# Patient Record
Sex: Female | Born: 1985 | Race: White | Hispanic: No | Smoking: Former smoker
Health system: Southern US, Community
[De-identification: ages and names within clinical notes are randomized; demographics above are authoritative.]

## PROBLEM LIST (undated history)

## (undated) DIAGNOSIS — M069 Rheumatoid arthritis, unspecified: Secondary | ICD-10-CM

## (undated) HISTORY — PX: TONSILLECTOMY AND ADENOIDECTOMY: SHX28

---

## 2015-12-31 ENCOUNTER — Ambulatory Visit (HOSPITAL_COMMUNITY)
Admission: RE | Admit: 2015-12-31 | Discharge: 2015-12-31 | Disposition: A | Discharge status: Home / Self Care | Payer: Medicaid Other | Source: Ambulatory Visit | Attending: Rheumatology | Admitting: Rheumatology

## 2015-12-31 ENCOUNTER — Other Ambulatory Visit (HOSPITAL_COMMUNITY): Payer: Self-pay | Admitting: Rheumatology

## 2015-12-31 DIAGNOSIS — Z5189 Encounter for other specified aftercare: Secondary | ICD-10-CM

## 2015-12-31 DIAGNOSIS — M069 Rheumatoid arthritis, unspecified: Secondary | ICD-10-CM | POA: Insufficient documentation

## 2015-12-31 DIAGNOSIS — Z79899 Other long term (current) drug therapy: Secondary | ICD-10-CM | POA: Diagnosis not present

## 2016-01-19 DIAGNOSIS — Z803 Family history of malignant neoplasm of breast: Secondary | ICD-10-CM | POA: Insufficient documentation

## 2016-09-23 ENCOUNTER — Other Ambulatory Visit: Payer: Self-pay | Admitting: Radiology

## 2016-09-23 DIAGNOSIS — Z79899 Other long term (current) drug therapy: Secondary | ICD-10-CM

## 2016-12-05 ENCOUNTER — Other Ambulatory Visit: Payer: Self-pay | Admitting: Rheumatology

## 2016-12-06 ENCOUNTER — Other Ambulatory Visit: Payer: Self-pay | Admitting: Radiology

## 2016-12-06 DIAGNOSIS — Z79899 Other long term (current) drug therapy: Secondary | ICD-10-CM

## 2016-12-06 LAB — COMPLETE METABOLIC PANEL WITH GFR
ALT: 11 U/L (ref 6–29)
AST: 16 U/L (ref 10–30)
Albumin: 3.6 g/dL (ref 3.6–5.1)
Alkaline Phosphatase: 46 U/L (ref 33–115)
BUN: 12 mg/dL (ref 7–25)
CALCIUM: 8.3 mg/dL — AB (ref 8.6–10.2)
CO2: 23 mmol/L (ref 20–31)
Chloride: 108 mmol/L (ref 98–110)
Creat: 0.73 mg/dL (ref 0.50–1.10)
GFR, Est African American: >89 mL/min (ref >=60)
GLUCOSE: 85 mg/dL (ref 65–99)
POTASSIUM: 4.3 mmol/L (ref 3.5–5.3)
Sodium: 139 mmol/L (ref 135–146)
Total Bilirubin: 0.3 mg/dL (ref 0.2–1.2)
Total Protein: 5.5 g/dL — ABNORMAL LOW (ref 6.1–8.1)

## 2016-12-06 LAB — CBC WITH DIFFERENTIAL/PLATELET
Basophils Absolute: 0 cells/uL (ref 0–200)
Basophils Relative: 0 %
EOS PCT: 14 %
Eosinophils Absolute: 812 cells/uL — ABNORMAL HIGH (ref 15–500)
HEMATOCRIT: 33.3 % — AB (ref 35.0–45.0)
HEMOGLOBIN: 10.4 g/dL — AB (ref 11.7–15.5)
LYMPHS ABS: 1624 {cells}/uL (ref 850–3900)
Lymphocytes Relative: 28 %
MCH: 27.4 pg (ref 27.0–33.0)
MCHC: 31.2 g/dL — AB (ref 32.0–36.0)
MCV: 87.6 fL (ref 80.0–100.0)
MONO ABS: 464 {cells}/uL (ref 200–950)
MPV: 11.7 fL (ref 7.5–12.5)
Monocytes Relative: 8 %
NEUTROS ABS: 2900 {cells}/uL (ref 1500–7800)
NEUTROS PCT: 50 %
Platelets: 262 10*3/uL (ref 140–400)
RBC: 3.8 MIL/uL (ref 3.80–5.10)
RDW: 16.9 % — ABNORMAL HIGH (ref 11.0–15.0)
WBC: 5.8 10*3/uL (ref 3.8–10.8)

## 2016-12-06 NOTE — Telephone Encounter (Signed)
Last Visit: 09/04/16 Next Visit due in December 2017. Message sent to the front to schedule patient.  Labs: 09/05/16 WNL. Patient coming to office today to update labs.  Okay to refill MTX?

## 2016-12-29 ENCOUNTER — Other Ambulatory Visit: Payer: Self-pay | Admitting: Rheumatology

## 2017-01-01 NOTE — Telephone Encounter (Signed)
We need to monitor calcium and hgb on next labs.

## 2017-01-01 NOTE — Telephone Encounter (Signed)
Last Visit: 09/04/16 Next Visit: 01/08/17 Labs: 12/06/16 Low Calcium 8.3, Anemia Hgb: 10.4  TB Gold: 01/11/16 Neg  Okay to refill Enbrel?

## 2017-01-08 ENCOUNTER — Encounter: Payer: Self-pay | Admitting: Rheumatology

## 2017-01-08 ENCOUNTER — Ambulatory Visit (INDEPENDENT_AMBULATORY_CARE_PROVIDER_SITE_OTHER): Payer: Medicaid Other | Admitting: Rheumatology

## 2017-01-08 ENCOUNTER — Ambulatory Visit: Payer: Self-pay | Admitting: Rheumatology

## 2017-01-08 VITALS — BP 119/63 | HR 95 | Resp 14 | Ht 65.0 in | Wt 138.0 lb

## 2017-01-08 DIAGNOSIS — M0579 Rheumatoid arthritis with rheumatoid factor of multiple sites without organ or systems involvement: Secondary | ICD-10-CM

## 2017-01-08 DIAGNOSIS — M65331 Trigger finger, right middle finger: Secondary | ICD-10-CM | POA: Diagnosis not present

## 2017-01-08 DIAGNOSIS — Z79899 Other long term (current) drug therapy: Secondary | ICD-10-CM

## 2017-01-08 DIAGNOSIS — M25532 Pain in left wrist: Secondary | ICD-10-CM | POA: Diagnosis not present

## 2017-01-08 MED ORDER — ETANERCEPT 50 MG/ML ~~LOC~~ SOSY
50.0000 mg | PREFILLED_SYRINGE | Freq: Once | SUBCUTANEOUS | Status: AC
  Administered 2017-01-08: 50 mg via SUBCUTANEOUS

## 2017-01-08 NOTE — Progress Notes (Signed)
Rheumatology Medication Review by a Pharmacist Does the patient feel that his/her medications are working for him/her?  Yes Has the patient been experiencing any side effects to the medications prescribed?  No Does the patient have any problems obtaining medications?  No - patient is interested in filling Enbrel at a pharmacy that will mail prescription to her  Issues to address at subsequent visits: None   Pharmacist comments:  Hayley Burke is a pleasant 31 yo who presents for follow up of her rheumatoid arthritis.  She is currently taking Enbrel weekly, methotrexate 0.8 mL weekly, and folic acid 2 mg daily.  Patient reports she takes naproxen approximately every other day for headache and she reports taking Excedrin migraine daily.  Counseled patient on the drug interaction between methotrexate and NSAIDs.  Advised patient to avoid use of NSAIDs with methotrexate.  Counseled patient to try to use acetaminophen for headaches.  Patient had standing labs on 12/06/16.  CMP showed total protein of 5.5 and calcium of 8.3.  CBC showed hemoglobin of 10.4 and hematocrit of 33.3.  Her most recent TB Gold was on 01/11/16 which was negative.  She will be due for TB Gold at this time.  Discussed pharmacies with mail order, and patient is interested in getting future refills at [facility].  Patient denies any questions regarding her medications.     Hayley Burke, Pharm.D., BCPS, CPP Clinical Pharmacist Pager: [phone] Phone: [phone] 01/08/2017 2:51 PM

## 2017-01-08 NOTE — Progress Notes (Signed)
Office Visit Note  Patient: Hayley Burke             Date of Birth: Sep 18, 1986           MRN: [id number]             PCP: SAPP, LINDSEY BRENT, PA-C Referring: No ref. provider found Visit Date: 01/08/2017 Occupation: '@GUAROCC'$ @    Subjective:  No chief complaint on file. Follow-up on rheumatoid arthritis. Doing well. Patient is doing well with her rheumatoid arthritis. She didn't do well with Humira so we switched her to Enbrel and since switching to Enbrel, she's done very well. She continues methotrexate 0.8 ML's per week.  She's had 2 minor flares and they usually involve her left wrist joint. It's associated with the change of weather or writing. She notices that when she uses a pen there is more of a joint pain and when she uses a pencil.  History of Present Illness: Hayley Burke is a 31 y.o. female     Activities of Daily Living:  Patient reports morning stiffness for 15 minutes.   Patient Denies nocturnal pain.  Difficulty dressing/grooming: Denies Difficulty climbing stairs: Denies Difficulty getting out of chair: Denies Difficulty using hands for taps, buttons, cutlery, and/or writing: Reports   No Rheumatology ROS completed.   PMFS History:  There are no active problems to display for this patient.   No past medical history on file.  No family history on file. No past surgical history on file. Social History   Social History Narrative  . No narrative on file     Objective: Vital Signs: BP 119/63 (BP Location: Left Arm, Patient Position: Sitting)   Pulse 95   Resp 14   Ht '5\' 5"'$  (1.651 m)   Wt 138 lb (62.6 kg)   BMI 22.96 kg/m    Physical Exam   Musculoskeletal Exam:  Full range of motion of all joints Grip strength is equal and strong bilaterally Fiber most tender points are all absent  CDAI Exam: No CDAI exam completed.  No synovitis on examination.  Investigation: No additional findings.  Orders Only on 12/06/2016  Component Date Value  Ref Range Status  . Sodium 12/06/2016 139  135 - 146 mmol/L Final  . Potassium 12/06/2016 4.3  3.5 - 5.3 mmol/L Final  . Chloride 12/06/2016 108  98 - 110 mmol/L Final  . CO2 12/06/2016 23  20 - 31 mmol/L Final  . Glucose, Bld 12/06/2016 85  65 - 99 mg/dL Final  . BUN 12/06/2016 12  7 - 25 mg/dL Final  . Creat 12/06/2016 0.73  0.50 - 1.10 mg/dL Final  . Total Bilirubin 12/06/2016 0.3  0.2 - 1.2 mg/dL Final  . Alkaline Phosphatase 12/06/2016 46  33 - 115 U/L Final  . AST 12/06/2016 16  10 - 30 U/L Final  . ALT 12/06/2016 11  6 - 29 U/L Final  . Total Protein 12/06/2016 5.5* 6.1 - 8.1 g/dL Final  . Albumin 12/06/2016 3.6  3.6 - 5.1 g/dL Final  . Calcium 12/06/2016 8.3* 8.6 - 10.2 mg/dL Final  . GFR, Est African American 12/06/2016 >89  >=60 mL/min Final  . GFR, Est Non African American 12/06/2016 >89  >=60 mL/min Final  . WBC 12/06/2016 5.8  3.8 - 10.8 K/uL Final  . RBC 12/06/2016 3.80  3.80 - 5.10 MIL/uL Final  . Hemoglobin 12/06/2016 10.4* 11.7 - 15.5 g/dL Final  . HCT 12/06/2016 33.3* 35.0 - 45.0 % Final  .  MCV 12/06/2016 87.6  80.0 - 100.0 fL Final  . MCH 12/06/2016 27.4  27.0 - 33.0 pg Final  . MCHC 12/06/2016 31.2* 32.0 - 36.0 g/dL Final  . RDW 12/06/2016 16.9* 11.0 - 15.0 % Final  . Platelets 12/06/2016 262  140 - 400 K/uL Final  . MPV 12/06/2016 11.7  7.5 - 12.5 fL Final  . Neutro Abs 12/06/2016 2900  1,500 - 7,800 cells/uL Final  . Lymphs Abs 12/06/2016 1624  850 - 3,900 cells/uL Final  . Monocytes Absolute 12/06/2016 464  200 - 950 cells/uL Final  . Eosinophils Absolute 12/06/2016 812* 15 - 500 cells/uL Final  . Basophils Absolute 12/06/2016 0  0 - 200 cells/uL Final  . Neutrophils Relative % 12/06/2016 50  % Final  . Lymphocytes Relative 12/06/2016 28  % Final  . Monocytes Relative 12/06/2016 8  % Final  . Eosinophils Relative 12/06/2016 14  % Final  . Basophils Relative 12/06/2016 0  % Final  . Smear Review 12/06/2016 Criteria for review not met   Final      Imaging: No results found.  Speciality Comments: No specialty comments available.    Procedures:  No procedures performed Allergies: Sulfa antibiotics   Assessment / Plan:     Visit Diagnoses: Rheumatoid arthritis with rheumatoid factor of multiple sites without organ or systems involvement ([place]) - +RF; +CCP; +ANA  High risk medications (not anticoagulants) long-term use - Methotrexate 0.8 ML's weekly (started March 2017); Folic acid 2 mg/dayHumira q2 weeks 02/2015) [inadequate response]; 09/04/2016: applied Enbrel    [place]: Rheumatoid arthritis. Patient is doing really well with Enbrel. Has had a flare to the left wrist 2 but it was minor. It is associated with writing, change in weather. She uses Voltaren gel with good relief  #2: Using methotrexate 0.8 ML's. Adequate response.  #3: Is due for TB Gold February 1 week of 2018. At that time she'll also get CBC with differential and CMP with GFR  #4: Is due for Enbrel shot today so we will give her on Enbrel pen to use in the office call us if she needs any refills.  Orders: No orders of the defined types were placed in this encounter.  No orders of the defined types were placed in this encounter.   Face-to-face time spent with patient was 30 minutes. 50% of time was spent in counseling and coordination of care.  Follow-Up Instructions: Return in about 4 years (around 01/08/2021) for RA,.   Eliezer Lofts, PA-C  Note - This record has been created using [employer].  Chart creation errors have been sought, but may not always  have been located. Such creation errors do not reflect on  the standard of medical care.

## 2017-01-18 ENCOUNTER — Telehealth: Payer: Self-pay | Admitting: Rheumatology

## 2017-01-18 NOTE — Telephone Encounter (Signed)
Please fax patient's labs to her PCP at [phone] attn: Skeet Latch

## 2017-01-18 NOTE — Telephone Encounter (Signed)
Labs faxed to PCP

## 2017-01-29 ENCOUNTER — Other Ambulatory Visit: Payer: Self-pay | Admitting: Rheumatology

## 2017-01-29 NOTE — Telephone Encounter (Signed)
Last Visit: 01/08/17 Next visit: 05/10/17 Labs: 01/06/17 Hgb: 10.4, Calcium 8.3   Okay to refill MTX?

## 2017-02-08 ENCOUNTER — Other Ambulatory Visit: Payer: Self-pay | Admitting: Rheumatology

## 2017-02-08 ENCOUNTER — Other Ambulatory Visit: Payer: Self-pay | Admitting: *Deleted

## 2017-02-08 DIAGNOSIS — Z79899 Other long term (current) drug therapy: Secondary | ICD-10-CM

## 2017-02-08 NOTE — Telephone Encounter (Signed)
Last Visit: 01/08/17 Next Visit: 05/10/17 Labs: 12/06/16 Calcium 8.3 Hgb 10.4 TB Gold: 01/11/16 Neg  Updating Tb Gold today  Okay to refill Enbrel?

## 2017-02-13 LAB — QUANTIFERON TB GOLD ASSAY (BLOOD)
Interferon Gamma Release Assay: NEGATIVE
QUANTIFERON TB AG MINUS NIL: 0.11 [IU]/mL
Quantiferon Nil Value: 0.07 IU/mL

## 2017-03-11 ENCOUNTER — Emergency Department (INDEPENDENT_AMBULATORY_CARE_PROVIDER_SITE_OTHER)
Admission: EM | Admit: 2017-03-11 | Discharge: 2017-03-11 | Disposition: A | Discharge status: Home / Self Care | Payer: Medicaid Other | Source: Home / Self Care | Attending: Family Medicine | Admitting: Family Medicine

## 2017-03-11 DIAGNOSIS — L03031 Cellulitis of right toe: Secondary | ICD-10-CM

## 2017-03-11 HISTORY — DX: Rheumatoid arthritis, unspecified: M06.9

## 2017-03-11 MED ORDER — CEPHALEXIN 500 MG PO CAPS: 500.0000 mg | ORAL_CAPSULE | Freq: Three times a day (TID) | ORAL | 0 refills | Status: DC

## 2017-03-11 NOTE — ED Triage Notes (Signed)
Patient states that she has athletes foot, right foot,between 4th and fifth toe. States it does not hurt but is red and swollen.

## 2017-03-11 NOTE — ED Provider Notes (Signed)
CSN: [id number]     Arrival date & time 03/11/17  1510 History   First MD Initiated Contact with Patient 03/11/17 1530     Chief Complaint  Patient presents with  . Foot Pain    Right    (Consider location/radiation/quality/duration/timing/severity/associated sxs/prior Treatment) HPI  Hayley Burke is a 31 y.o. female presenting to UC with c/o Right little toe pain, redness, and swelling that started 2-3 days ago. Pt notes she initially thought it was athletes foot as her toe was red and moist but late last night it became swollen and more painful.  She did use OTC hydrocortisone cream with mild relief. She notes it does look better today but is still red. She is on methotrexate for RA and wonders if that could play a role in her current symptoms. Denies fever, chills. Denies known insect bite to toe.    Past Medical History:  Diagnosis Date  . Rheumatoid arthritis [facility])    Past Surgical History:  Procedure Laterality Date  . CESAREAN SECTION    . TONSILLECTOMY AND ADENOIDECTOMY     History reviewed. No pertinent family history. Social History  Substance Use Topics  . Smoking status: Former Games developer  . Smokeless tobacco: Never Used  . Alcohol use No   OB History    Gravida Para Term Preterm AB Living   1             SAB TAB Ectopic Multiple Live Births                 Review of Systems  Musculoskeletal: Positive for arthralgias, joint swelling and myalgias.       Right little toe  Skin: Positive for color change. Negative for wound.  Neurological: Negative for weakness and numbness.    Allergies  Sulfa antibiotics  Home Medications   Prior to Admission medications   Medication Sig Start Date End Date Taking? Authorizing Provider  aspirin-acetaminophen-caffeine (EXCEDRIN MIGRAINE) [phone] MG tablet Take 1 tablet by mouth every 6 (six) hours as needed for headache.   Yes Historical Provider, MD  ENBREL SURECLICK 50 MG/ML injection INJECT 1 DEVICE SUBCUTANEOUSLY ONCE A  WEEK 02/08/17  Yes Naitik Panwala, PA-C  folic acid (FOLVITE) 1 MG tablet Take 2 mg by mouth daily.   Yes Historical Provider, MD  methotrexate 50 MG/2ML injection INJECT 0.8 ML SUBCUTANEOUSLY ONCE A WEEK 01/29/17 04/29/17 Yes Naitik Panwala, PA-C  naproxen (NAPROSYN) 250 MG tablet Take by mouth 2 (two) times daily with a meal.   Yes Historical Provider, MD  cephALEXin (KEFLEX) 500 MG capsule Take 1 capsule (500 mg total) by mouth 3 (three) times daily. 03/11/17   Junius Finner, PA-C   Meds Ordered and Administered this Visit  Medications - No data to display  BP (!) 144/86   Pulse (!) 120   Temp 97.6 F (36.4 C) (Oral)   Ht 5\' 5"  (1.651 m)   Wt 127 lb (57.6 kg)   LMP 02/25/2017   SpO2 100%   BMI 21.13 kg/m  No data found.   Physical Exam  Constitutional: She is oriented to person, place, and time. She appears well-developed and well-nourished. No distress.  HENT:  Head: Normocephalic and atraumatic.  Eyes: EOM are normal.  Neck: Normal range of motion.  Cardiovascular: Tachycardia present.   Tachycardia, regular rhythm  Pulmonary/Chest: Effort normal.  Musculoskeletal: Normal range of motion. She exhibits edema and tenderness.  Neurological: She is alert and oriented to person, place, and time.  Skin:  Skin is warm and dry. Capillary refill takes less than 2 seconds. She is not diaphoretic. There is erythema.  Right little toe: skin in tact. Dry. Erythema of little toe and proximal aspect of 5th metatarsal. Mild edema. Mild tenderness. No induration or fluctuance.   Psychiatric: She has a normal mood and affect. Her behavior is normal.  Nursing note and vitals reviewed.   Urgent Care Course     Procedures (including critical care time)  Labs Review Labs Reviewed - No data to display  Imaging Review No results found.   MDM   1. Cellulitis of fifth toe of right foot    Cellulitis of Right little toe Rx: Keflex  Home care instructions provided Encouraged f/u with  PCP later this week if not improving.     Junius Finner, PA-C 03/11/17 1557    Junius Finner, PA-C 03/11/17 1558

## 2017-03-24 ENCOUNTER — Other Ambulatory Visit: Payer: Self-pay | Admitting: Rheumatology

## 2017-03-26 ENCOUNTER — Telehealth: Payer: Self-pay | Admitting: Rheumatology

## 2017-03-26 ENCOUNTER — Other Ambulatory Visit: Payer: Self-pay | Admitting: *Deleted

## 2017-03-26 DIAGNOSIS — Z79899 Other long term (current) drug therapy: Secondary | ICD-10-CM

## 2017-03-26 LAB — CBC WITH DIFFERENTIAL/PLATELET
Basophils Absolute: 0 {cells}/uL (ref 0–200)
Basophils Relative: 0 %
Eosinophils Absolute: 204 {cells}/uL (ref 15–500)
Eosinophils Relative: 3 %
HCT: 31.2 % — ABNORMAL LOW (ref 35.0–45.0)
Hemoglobin: 9.5 g/dL — ABNORMAL LOW (ref 11.7–15.5)
Lymphocytes Relative: 22 %
Lymphs Abs: 1496 {cells}/uL (ref 850–3900)
MCH: 26.1 pg — ABNORMAL LOW (ref 27.0–33.0)
MCHC: 30.4 g/dL — ABNORMAL LOW (ref 32.0–36.0)
MCV: 85.7 fL (ref 80.0–100.0)
MPV: 11 fL (ref 7.5–12.5)
Monocytes Absolute: 544 {cells}/uL (ref 200–950)
Monocytes Relative: 8 %
Neutro Abs: 4556 {cells}/uL (ref 1500–7800)
Neutrophils Relative %: 67 %
Platelets: 385 K/uL (ref 140–400)
RBC: 3.64 MIL/uL — ABNORMAL LOW (ref 3.80–5.10)
RDW: 17.1 % — ABNORMAL HIGH (ref 11.0–15.0)
WBC: 6.8 K/uL (ref 3.8–10.8)

## 2017-03-26 NOTE — Telephone Encounter (Signed)
Patient will come to office to update labs. Enbrel refill sent to the lab.

## 2017-03-26 NOTE — Telephone Encounter (Signed)
Patient left a message this morning stating that she will get her lab work done today and asked if we could send in a refill for her Enbrel.  CB#[phone].  Thank you.

## 2017-03-26 NOTE — Telephone Encounter (Signed)
Labs released and faxed  

## 2017-03-26 NOTE — Telephone Encounter (Signed)
Patient called stating that she is going to [facility] in [place], to have her labs done.  The lab located on [phone].  Thank you.

## 2017-03-26 NOTE — Telephone Encounter (Signed)
Last Visit: 01/08/17 Next Visit: 05/10/17 Labs: 12/06/16 Calcium 8.3 Hgb 10.4 TB Gold:02/08/17 Neg  Left message to advise patient she is due for labs.   Okay to refill 30 day supply Enbrel?

## 2017-03-27 LAB — COMPLETE METABOLIC PANEL WITH GFR
ALT: 15 U/L (ref 6–29)
AST: 20 U/L (ref 10–30)
Albumin: 3.8 g/dL (ref 3.6–5.1)
Alkaline Phosphatase: 56 U/L (ref 33–115)
BUN: 16 mg/dL (ref 7–25)
CO2: 25 mmol/L (ref 20–31)
CREATININE: 0.9 mg/dL (ref 0.50–1.10)
Calcium: 8.4 mg/dL — ABNORMAL LOW (ref 8.6–10.2)
Chloride: 108 mmol/L (ref 98–110)
GFR, Est African American: >89 mL/min (ref >=60)
GFR, Est Non African American: 85 mL/min (ref >=60)
Glucose, Bld: 84 mg/dL (ref 65–99)
Potassium: 4.1 mmol/L (ref 3.5–5.3)
SODIUM: 142 mmol/L (ref 135–146)
Total Bilirubin: 0.2 mg/dL (ref 0.2–1.2)
Total Protein: 6.1 g/dL (ref 6.1–8.1)

## 2017-03-27 NOTE — Progress Notes (Signed)
anemia

## 2017-03-29 ENCOUNTER — Telehealth: Payer: Self-pay | Admitting: Rheumatology

## 2017-03-29 NOTE — Telephone Encounter (Signed)
Patient would like to talk to someone in regards to her lab results.  CB#[phone].  Thank you

## 2017-03-29 NOTE — Telephone Encounter (Signed)
Attempted to contact patient and left message for patient to call the office.  

## 2017-03-30 NOTE — Telephone Encounter (Signed)
Patient wanted to know her exact Hgb level and her Plt count. Advised patient of requested information.

## 2017-04-24 ENCOUNTER — Other Ambulatory Visit: Payer: Self-pay | Admitting: Rheumatology

## 2017-04-24 NOTE — Telephone Encounter (Signed)
Last Visit: 01/08/17 Next Visit: 05/10/17 Labs: 03/26/17 anemia 9.5 previously 10.4  TB Gold: 02/08/17  Okay to refill Enbrel?

## 2017-05-03 DIAGNOSIS — Z79899 Other long term (current) drug therapy: Secondary | ICD-10-CM | POA: Insufficient documentation

## 2017-05-03 DIAGNOSIS — M0579 Rheumatoid arthritis with rheumatoid factor of multiple sites without organ or systems involvement: Secondary | ICD-10-CM | POA: Insufficient documentation

## 2017-05-03 DIAGNOSIS — M65331 Trigger finger, right middle finger: Secondary | ICD-10-CM | POA: Insufficient documentation

## 2017-05-03 DIAGNOSIS — M25532 Pain in left wrist: Secondary | ICD-10-CM | POA: Insufficient documentation

## 2017-05-03 NOTE — Progress Notes (Deleted)
Office Visit Note  Patient: Hayley Burke             Date of Birth: 15-Dec-1986           MRN: [id number]             PCP: Wynell Balloon, PA-C Referring: Wynell Balloon, PA* Visit Date: 05/10/2017 Occupation: @GUAROCC @    Subjective:  No chief complaint on file.   History of Present Illness: Hayley Burke is a 31 y.o. female ***   Activities of Daily Living:  Patient reports morning stiffness for *** {minute/hour:19697}.   Patient {ACTIONS;DENIES/REPORTS:21021675::"Denies"} nocturnal pain.  Difficulty dressing/grooming: {ACTIONS;DENIES/REPORTS:21021675::"Denies"} Difficulty climbing stairs: {ACTIONS;DENIES/REPORTS:21021675::"Denies"} Difficulty getting out of chair: {ACTIONS;DENIES/REPORTS:21021675::"Denies"} Difficulty using hands for taps, buttons, cutlery, and/or writing: {ACTIONS;DENIES/REPORTS:21021675::"Denies"}   No Rheumatology ROS completed.   PMFS History:  Patient Active Problem List   Diagnosis Date Noted  . 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 High risk medications (not anticoagulants) long-term use 05/03/2017  . Pain in left wrist 05/03/2017  . Trigger finger, right middle finger 05/03/2017    Past Medical History:  Diagnosis Date  . Rheumatoid arthritis ([facility]CC)     No family history on file. Past Surgical History:  Procedure Laterality Date  . CESAREAN SECTION    . TONSILLECTOMY AND ADENOIDECTOMY     Social History   Social History Narrative  . No narrative on file     Objective: Vital Signs: LMP 02/25/2017    Physical Exam   Musculoskeletal Exam: ***  CDAI Exam: No CDAI exam completed.    Investigation: Findings:  02/08/2017 negative TB gold   CBC Latest Ref Rng & Units 03/26/2017 12/06/2016  WBC 3.8 - 10.8 K/uL 6.8 5.8  Hemoglobin 11.7 - 15.5 g/dL 12/08/2016) 10.4(L)  Hematocrit 35.0 - 45.0 % 31.2(L) 33.3(L)  Platelets 140 - 400 K/uL 385 262     CMP Latest Ref Rng & Units 03/26/2017 12/06/2016  Glucose 65 - 99 mg/dL 84 85  BUN 7 - 25 mg/dL 16 12  Creatinine 12/08/2016 - 1.10 mg/dL 9.39 0.30  Sodium 0.92 - 146 mmol/L 142 139  Potassium 3.5 - 5.3 mmol/L 4.1 4.3  Chloride 98 - 110 mmol/L 108 108  CO2 20 - 31 mmol/L 25 23  Calcium 8.6 - 10.2 mg/dL 330) 8.3(L)  Total Protein 6.1 - 8.1 g/dL 6.1 0.7(M)  Total Bilirubin 0.2 - 1.2 mg/dL 0.2 0.3  Alkaline Phos 33 - 115 U/L 56 46  AST 10 - 30 U/L 20 16  ALT 6 - 29 U/L 15 11    Imaging: No results found.  Speciality Comments: No specialty comments available.    Procedures:  No procedures performed Allergies: Sulfa antibiotics   Assessment / Plan:     Visit Diagnoses: High risk medications (not anticoagulants) long-term use  Pain in left wrist  Rheumatoid arthritis with rheumatoid factor of multiple sites without organ or systems involvement (HCC)  Trigger finger, right middle finger    Orders: No orders of the defined types were placed in this encounter.  No orders of the defined types were placed in this encounter.   Face-to-face time spent with patient was *** minutes. 50% of time was spent in counseling and coordination of care.  Follow-Up Instructions: No Follow-up on file.   Amy Littrell, RT  Note - This record has been created using [employer].  Chart creation errors have been sought, but may not always  have been located. Such creation errors do not reflect on  the standard of medical care.

## 2017-05-10 ENCOUNTER — Ambulatory Visit: Payer: Medicaid Other | Admitting: Rheumatology

## 2017-05-24 NOTE — Progress Notes (Deleted)
Office Visit Note  Patient: Hayley Burke             Date of Birth: Apr 29, 1986           MRN: [id number]             PCP: Wynell Balloon, PA-C Referring: Wynell Balloon, PA* Visit Date: 05/29/2017 Occupation: @GUAROCC @    Subjective:  No chief complaint on file.   History of Present Illness: Hayley Burke is a 31 y.o. female ***   Activities of Daily Living:  Patient reports morning stiffness for *** {minute/hour:19697}.   Patient {ACTIONS;DENIES/REPORTS:21021675::"Denies"} nocturnal pain.  Difficulty dressing/grooming: {ACTIONS;DENIES/REPORTS:21021675::"Denies"} Difficulty climbing stairs: {ACTIONS;DENIES/REPORTS:21021675::"Denies"} Difficulty getting out of chair: {ACTIONS;DENIES/REPORTS:21021675::"Denies"} Difficulty using hands for taps, buttons, cutlery, and/or writing: {ACTIONS;DENIES/REPORTS:21021675::"Denies"}   No Rheumatology ROS completed.   PMFS History:  Patient Active Problem List   Diagnosis Date Noted  . 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 High risk medications (not anticoagulants) long-term use 05/03/2017  . Pain in left wrist 05/03/2017  . Trigger finger, right middle finger 05/03/2017    Past Medical History:  Diagnosis Date  . Rheumatoid arthritis ([facility]CC)     No family history on file. Past Surgical History:  Procedure Laterality Date  . CESAREAN SECTION    . TONSILLECTOMY AND ADENOIDECTOMY     Social History   Social History Narrative  . No narrative on file     Objective: Vital Signs: LMP 02/25/2017    Physical Exam   Musculoskeletal Exam: ***  CDAI Exam: No CDAI exam completed.    Investigation: Findings:  Her labs from January 2017 showed that her CBC and comprehensive metabolic panel was normal.  White cell count was elevated at 13.8, which is prednisone effect.  Sed rate was 2.  Hepatitis panel and HIV were negative.  TSH was normal.  CK  was normal.  Immunoglobulins were normal.  Rheumatoid factor was 13, which was negative.  CCP was positive at more than 250.  Her ANA was positive at 1:280.  Her UA showed some esterase and white cells, but according to her, her repeat urine at her PCP was normal.  Her TB Gold initially was indeterminate, but repeat test was negative.  Her SPEP was negative.   Her labs from 2013 showed a sed rate of 75, C-reactive protein 11.9, rheumatoid factor 301, CCP more than 250, and a hemoglobin of 8.4.    12/31/2015 X-rays of the bilateral hands, 2 views in the office today, showed bilateral PIP narrowing; bilateral first, second, and third MCP narrowing; juxtaarticular osteopenia; and bilateral intercarpal joint and radiocarpal joint space narrowing with erosive changes in the carpal bones.  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  Bilateral feet x-rays showed bilateral fifth MTP erosive changes and PIP and DIP narrowing consistent with rheumatoid arthritis also.   Bilateral elbow joint x-rays showed joint space narrowing with some cystic erosive changes in her elbow joints.       Imaging: No results found.  Speciality Comments: No specialty comments available.    Procedures:  No procedures performed Allergies: Sulfa antibiotics   Assessment / Plan:     Visit Diagnoses: Rheumatoid arthritis with rheumatoid factor of multiple sites without organ or systems involvement (HCC)  High risk medications (not anticoagulants) long-term use  Pain in left wrist  Trigger finger, right middle finger    Orders: No orders of the defined types were placed  in this encounter.  No orders of the defined types were placed in this encounter.   Face-to-face time spent with patient was *** minutes. 50% of time was spent in counseling and coordination of care.  Follow-Up Instructions: No Follow-up on file.   Frankee Gritz, RT  Note - This record has been created using  [employer].  Chart creation errors have been sought, but may not always  have been located. Such creation errors do not reflect on  the standard of medical care.

## 2017-05-29 ENCOUNTER — Ambulatory Visit: Payer: Medicaid Other | Admitting: Rheumatology

## 2017-05-29 DIAGNOSIS — Z862 Personal history of diseases of the blood and blood-forming organs and certain disorders involving the immune mechanism: Secondary | ICD-10-CM | POA: Insufficient documentation

## 2017-05-29 NOTE — Progress Notes (Signed)
Office Visit Note  Patient: Hayley Burke             Date of Birth: 01-28-1986           MRN: [id number]             PCP: Gwendlyn Deutscher, PA-C Referring: Gwendlyn Deutscher, [place]* Visit Date: 05/30/2017 Occupation: _0 @    Subjective:  Pain hands.   History of Present Illness: Hayley Burke is a 31 y.o. female with history of sero positive rheumatoid arthritis. She states she has not had any joint swelling. She does complain of some discomfort in her wrists joints especially with some impact. Especially her left wrist. She is left-handed. The left third trigger finger occasionally bothers her.   Activities of Daily Living:  Patient reports morning stiffness for 0 minute.   Patient Reports nocturnal pain.  Difficulty dressing/grooming: Denies Difficulty climbing stairs: Denies Difficulty getting out of chair: Denies Difficulty using hands for taps, buttons, cutlery, and/or writing: Denies   Review of Systems  Constitutional: Negative for fatigue, night sweats, weight gain, weight loss and weakness.  HENT: Negative for mouth sores, trouble swallowing, trouble swallowing, mouth dryness and nose dryness.   Eyes: Positive for dryness. Negative for pain, redness and visual disturbance.  Respiratory: Negative for cough, shortness of breath and difficulty breathing.   Cardiovascular: Negative for chest pain, palpitations, hypertension, irregular heartbeat and swelling in legs/feet.  Gastrointestinal: Positive for constipation. Negative for blood in stool and diarrhea.  Endocrine: Negative for increased urination.  Genitourinary: Negative for vaginal dryness.  Musculoskeletal: Positive for arthralgias and joint pain. Negative for joint swelling, myalgias, muscle weakness, morning stiffness, muscle tenderness and myalgias.  Skin: Negative for color change, rash, hair loss, skin tightness, ulcers and sensitivity to sunlight.  Allergic/Immunologic: Negative for susceptible to  infections.  Neurological: Negative for dizziness, memory loss and night sweats.  Hematological: Negative for swollen glands.  Psychiatric/Behavioral: Negative for depressed mood and sleep disturbance. The patient is not nervous/anxious.     PMFS History:  Patient Active Problem List   Diagnosis Date Noted  . History of anemia 05/29/2017  . Rheumatoid arthritis with rheumatoid factor of multiple sites without organ or systems involvement ([place]) 05/03/2017  . High risk medications (not anticoagulants) long-term use 05/03/2017  . Pain in left wrist 05/03/2017  . Trigger finger, right middle finger 05/03/2017    Past Medical History:  Diagnosis Date  . Rheumatoid arthritis ([place])     History reviewed. No pertinent family history. Past Surgical History:  Procedure Laterality Date  . CESAREAN SECTION    . TONSILLECTOMY AND ADENOIDECTOMY     Social History   Social History Narrative  . No narrative on file     Objective: Vital Signs: BP 115/72 (BP Location: Left Arm, Patient Position: Sitting, Cuff Size: Normal)   Pulse 93   Resp 12   Ht 5' 5" (1.651 m)   Wt 136 lb (61.7 kg)   LMP 05/04/2017   BMI 22.63 kg/m    Physical Exam  Constitutional: She is oriented to person, place, and time. She appears well-developed and well-nourished.  HENT:  Head: Normocephalic and atraumatic.  Eyes: Conjunctivae and EOM are normal.  Neck: Normal range of motion.  Cardiovascular: Normal rate, regular rhythm, normal heart sounds and intact distal pulses.   Pulmonary/Chest: Effort normal and breath sounds normal.  Abdominal: Soft. Bowel sounds are normal.  Lymphadenopathy:    She has no cervical adenopathy.  Neurological: She is alert  and oriented to person, place, and time.  Skin: Skin is warm and dry. Capillary refill takes less than 2 seconds.  Psychiatric: She has a normal mood and affect. Her behavior is normal.  Nursing note and vitals reviewed.    Musculoskeletal Exam: C-spine and  thoracic lumbar spine good range of motion. Shoulder joints elbow joints wrist joints with good range of motion. She has synovitis over her left wrist joint and over her left third MCP joint. Hip joints knee joints ankles MTPs PIPs DIPs with good range of motion with no synovitis.  CDAI Exam: CDAI Homunculus Exam:   Tenderness:  LUE: wrist Left hand: 3rd MCP  Swelling:  LUE: wrist Left hand: 3rd MCP  Joint Counts:  CDAI Tender Joint count: 2 CDAI Swollen Joint count: 2  Global Assessments:  Patient Global Assessment: 4 Provider Global Assessment: 4  CDAI Calculated Score: 12    Investigation: Findings:   Her labs from January 2017 showed that her CBC and comprehensive metabolic panel was normal.  White cell count was elevated at 13.8, which is prednisone effect.  Sed rate was 2.  Hepatitis panel and HIV were negative.  TSH was normal.  CK was normal.  Immunoglobulins were normal.  Rheumatoid factor was 13, which was negative.  CCP was positive at more than 250.  Her ANA was positive at 1:280.  Her UA showed some esterase and white cells, but according to her, her repeat urine at her PCP was normal.  Her TB Gold initially was indeterminate, but repeat test was negative.  Her SPEP was negative.  12/31/2015 .  X-rays of the bilateral hands, 2 views in the office today, showed bilateral PIP narrowing; bilateral first, second, and third MCP narrowing; juxtaarticular osteopenia; and bilateral intercarpal joint and radiocarpal joint space narrowing with erosive changes in the carpal bones.  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  Bilateral feet x-rays showed bilateral fifth MTP erosive changes and PIP and DIP narrowing consistent with rheumatoid arthritis also. Bilateral elbow joint x-rays showed joint space narrowing with some cystic erosive changes in her elbow joints.    Her labs from 2013 showed a sed rate of 75, C-reactive protein 11.9, rheumatoid  factor 301, CCP more than 250, and a hemoglobin of 8.4.   02/08/2017 negative TB gold   CBC Latest Ref Rng & Units 03/26/2017 12/06/2016  WBC 3.8 - 10.8 K/uL 6.8 5.8  Hemoglobin 11.7 - 15.5 g/dL 9.5(L) 10.4(L)  Hematocrit 35.0 - 45.0 % 31.2(L) 33.3(L)  Platelets 140 - 400 K/uL 385 262    CMP Latest Ref Rng & Units 03/26/2017 12/06/2016  Glucose 65 - 99 mg/dL 84 85  BUN 7 - 25 mg/dL 16 12  Creatinine 0.50 - 1.10 mg/dL 0.90 0.73  Sodium 135 - 146 mmol/L 142 139  Potassium 3.5 - 5.3 mmol/L 4.1 4.3  Chloride 98 - 110 mmol/L 108 108  CO2 20 - 31 mmol/L 25 23  Calcium 8.6 - 10.2 mg/dL 8.4(L) 8.3(L)  Total Protein 6.1 - 8.1 g/dL 6.1 5.5(L)  Total Bilirubin 0.2 - 1.2 mg/dL 0.2 0.3  Alkaline Phos 33 - 115 U/L 56 46  AST 10 - 30 U/L 20 16  ALT 6 - 29 U/L 15 11     Imaging: Xr Foot 2 Views Left  Result Date: 05/30/2017 No significant MTP joint space narrowing was noted. Erosions were noted in the left fifth MTP joint and possibly fourth MTP joint. No intertarsal joint space narrowing or  erosions were noted. Impression: Erosive inflammatory arthritis consistent with rheumatoid arthritis  Xr Foot 2 Views Right  Result Date: 05/30/2017 Mild first MTP narrowing was noted. Erosive changes noted in right third and fourth and fifth MTP joints. Mild metatarsotarsal joint narrowing was noted. No intertarsal joint space narrowing was noted. Impression: Erosive inflammatory arthritis consistent with rheumatoid arthritis.  Xr Hand 2 View Left  Result Date: 05/30/2017 Juxta articular osteopenia noted. She has severe narrowing of the left first and third MCP joint with erosive changes in the left first MCP joint minimal PIP narrowing was noted. Metacarpocarpal intercarpal joint and radiocarpal joints severe narrowing was noted with erosive changes. Ulnar styloid erosion was noted. Impression: Erosive inflammatory arthritis consistent with rheumatoid arthritis  Xr Hand 2 View Right  Result Date:  05/30/2017 Right first, second and third MCP narrowing noted juxta articular osteopenia noted. Erosive changes were noted in the right first and second MCP joint of PIP joint space narrowing was noted. Metacarpocarpal intercarpal joint and radiocarpal joint space narrowing was noted. Erosive changes were noted in all carpal bones. Impression: These findings are consistent with erosive rheumatoid arthritis   Speciality Comments: No specialty comments available.    Procedures:  No procedures performed Allergies: Sulfa antibiotics   Assessment / Plan:     Visit Diagnoses: Rheumatoid arthritis with rheumatoid factor of multiple sites without organ or systems involvement ([place]) - +antiCCP + RF + ANA, high ESR, erosive changes hands feet. The contractures have resolved. Although she continues to have synovitis in her left wrist and left hand MCP joints. She is some discomfort in her bilateral hands and her bilateral feet. The synovitis is described as above. I'll obtain follow-up x-rays today. He also had detailed discussion regarding treatment options. She's been on Enbrel about 5-6 months without adequate response. She has failed Humira in the past. We discussed possible option of using Orencia. After indications CONTRAINDICATIONS were discussed at length. She wanted to proceed with Orencia. We will apply for Orencia subcutaneous. She will stop Enbrel one week prior to her Orencia injection when approved.  Pain in both hands - Plan: XR Hand 2 View Right, XR Hand 2 View Left  Pain in both feet - Plan: XR Foot 2 Views Right, XR Foot 2 Views Left  High risk medications  - Enbrel 50 mg sq qweek(09/15/2016), methotrexate 0.8 ML subcutaneous every week, folic acid 2 mg by mouth daily, Failed Humira  Trigger finger, left middle finger: It is causing her only intermittently at this point she is not interested in injection.  History of anemia - on iron supplement . I've advised to follow up with her  PCP   Orders: Orders Placed This Encounter  Procedures  . XR Hand 2 View Right  . XR Hand 2 View Left  . XR Foot 2 Views Right  . XR Foot 2 Views Left   No orders of the defined types were placed in this encounter.   Face-to-face time spent with patient was 30 minutes. 50% of time was spent in counseling and coordination of care.  Follow-Up Instructions: Return in about 5 months (around 10/30/2017) for Rheumatoid arthritis.   Bo Merino, MD  Note - This record has been created using Editor, commissioning.  Chart creation errors have been sought, but may not always  have been located. Such creation errors do not reflect on  the standard of medical care.

## 2017-05-30 ENCOUNTER — Ambulatory Visit (INDEPENDENT_AMBULATORY_CARE_PROVIDER_SITE_OTHER): Payer: Medicaid Other

## 2017-05-30 ENCOUNTER — Telehealth: Payer: Self-pay

## 2017-05-30 ENCOUNTER — Ambulatory Visit (INDEPENDENT_AMBULATORY_CARE_PROVIDER_SITE_OTHER): Payer: Self-pay

## 2017-05-30 ENCOUNTER — Encounter: Payer: Self-pay | Admitting: Rheumatology

## 2017-05-30 ENCOUNTER — Ambulatory Visit (INDEPENDENT_AMBULATORY_CARE_PROVIDER_SITE_OTHER): Payer: Medicaid Other | Admitting: Rheumatology

## 2017-05-30 VITALS — BP 115/72 | HR 93 | Resp 12 | Ht 65.0 in | Wt 136.0 lb

## 2017-05-30 DIAGNOSIS — M79671 Pain in right foot: Secondary | ICD-10-CM | POA: Diagnosis not present

## 2017-05-30 DIAGNOSIS — M0579 Rheumatoid arthritis with rheumatoid factor of multiple sites without organ or systems involvement: Secondary | ICD-10-CM | POA: Diagnosis not present

## 2017-05-30 DIAGNOSIS — M79641 Pain in right hand: Secondary | ICD-10-CM

## 2017-05-30 DIAGNOSIS — M65332 Trigger finger, left middle finger: Secondary | ICD-10-CM

## 2017-05-30 DIAGNOSIS — M79672 Pain in left foot: Secondary | ICD-10-CM | POA: Diagnosis not present

## 2017-05-30 DIAGNOSIS — Z79899 Other long term (current) drug therapy: Secondary | ICD-10-CM | POA: Diagnosis not present

## 2017-05-30 DIAGNOSIS — M79642 Pain in left hand: Secondary | ICD-10-CM

## 2017-05-30 DIAGNOSIS — Z862 Personal history of diseases of the blood and blood-forming organs and certain disorders involving the immune mechanism: Secondary | ICD-10-CM | POA: Diagnosis not present

## 2017-05-30 NOTE — Progress Notes (Signed)
Pharmacy Note  Subjective: Patient presents today to the [facility] to see Dr. Corliss Skains.  Patient is currently taking Enbrel 50 mg weekly, methotrexate 0.8 mL and folic acid 2 mg daily.  Decision was made to change patient from Enbrel to Orencia.  Patient seen by the pharmacist for counseling on subcutaneous Orencia.    Objective: CBC    Component Value Date/Time   WBC 6.8 03/26/2017 1526   RBC 3.64 (L) 03/26/2017 1526   HGB 9.5 (L) 03/26/2017 1526   HCT 31.2 (L) 03/26/2017 1526   PLT 385 03/26/2017 1526   MCV 85.7 03/26/2017 1526   MCH 26.1 (L) 03/26/2017 1526   MCHC 30.4 (L) 03/26/2017 1526   RDW 17.1 (H) 03/26/2017 1526   LYMPHSABS 1,496 03/26/2017 1526   MONOABS 544 03/26/2017 1526   EOSABS 204 03/26/2017 1526   BASOSABS 0 03/26/2017 1526   CMP     Component Value Date/Time   NA 142 03/26/2017 1526   K 4.1 03/26/2017 1526   CL 108 03/26/2017 1526   CO2 25 03/26/2017 1526   GLUCOSE 84 03/26/2017 1526   BUN 16 03/26/2017 1526   CREATININE 0.90 03/26/2017 1526   CALCIUM 8.4 (L) 03/26/2017 1526   PROT 6.1 03/26/2017 1526   ALBUMIN 3.8 03/26/2017 1526   AST 20 03/26/2017 1526   ALT 15 03/26/2017 1526   ALKPHOS 56 03/26/2017 1526   BILITOT 0.2 03/26/2017 1526   GFRNONAA 85 03/26/2017 1526   GFRAA >89 03/26/2017 1526   TB Gold: negative (02/08/17) Hepatitis panel: negative (12/31/15) HIV: negative (12/31/15) SPEP: normal (12/31/15) Immunoglobulins: normal (12/31/15)  Assessment/Plan:  Counseled patient that Orencia is a selective T-cell costimulation blocker indicated for rheumatoid arthritis.  Counseled patient on purpose, proper use, and adverse effects of Orencia. The most common adverse effects are increased risk of infections, headache, and infusion reactions.  There is the possibility of an increased risk of malignancy but it is not well understood if this increased risk is due to the medication or the disease state.  Provided patient with medication  education material and answered all questions.  Patient consented to [facility].  Will upload consent into patient's chart.  Will apply for Orencia subcutaneous injections through patient's insurance.  Reviewed storage information for Orencia.  Advised initial injection must be administered in office.    Patient is currently out of Enbrel.  Medication Samples have been provided to the patient while Orencia copay is processing.    Drug name: Enbrel       Strength: 50 mg        Qty: 1  LOT: [id number]  Exp.Date: 4/20  Dosing instructions: Inject under the skin once a week.    The patient has been instructed regarding the correct time, dose, and frequency of taking this medication, including desired effects and most common side effects.  Advised patient we will contact her once Orencia PA is processed.  Patient is aware that initial Orencia cannot be started until one week after her most recent Enbrel.     [name], Pharm.D., BCPS Clinical Pharmacist Pager: [phone] Phone: [phone] 05/30/2017 3:37 PM

## 2017-05-30 NOTE — Patient Instructions (Addendum)
We recommend getting a pneumonia vaccine if you have not already had one.  Please discus this with your primary care provider.    Abatacept solution for injection (subcutaneous or intravenous use) What is this medicine? ABATACEPT (a ba TA sept) is used to treat moderate to severe active rheumatoid arthritis or psoriatic arthritis in adults. This medicine is also used to treat juvenile idiopathic arthritis. This medicine may be used for other purposes; ask your health care provider or pharmacist if you have questions. COMMON BRAND NAME(S): Orencia What should I tell my health care provider before I take this medicine? They need to know if you have any of these conditions: -are taking other medicines to treat rheumatoid arthritis -COPD -diabetes -infection or history of infections -recently received or scheduled to receive a vaccine -scheduled to have surgery -tuberculosis, a positive skin test for tuberculosis or have recently been in close contact with someone who has tuberculosis -viral hepatitis -an unusual or allergic reaction to abatacept, other medicines, foods, dyes, or preservatives -pregnant or trying to get pregnant -breast-feeding How should I use this medicine? This medicine is for infusion into a vein or for injection under the skin. Infusions are given by a health care professional in a hospital or clinic setting. If you are to give your own medicine at home, you will be taught how to prepare and give this medicine under the skin. Use exactly as directed. Take your medicine at regular intervals. Do not take your medicine more often than directed. It is important that you put your used needles and syringes in a special sharps container. Do not put them in a trash can. If you do not have a sharps container, call your pharmacist or healthcare provider to get one. Talk to your pediatrician regarding the use of this medicine in children. While infusions in a clinic may be prescribed  for children as young as 2 years for selected conditions, precautions do apply. Overdosage: If you think you have taken too much of this medicine contact a poison control center or emergency room at once. NOTE: This medicine is only for you. Do not share this medicine with others. What if I miss a dose? This medicine is used once a week if given by injection under the skin. If you miss a dose, take it as soon as you can. If it is almost time for your next dose, take only that dose. Do not take double or extra doses. If you are to be given an infusion, it is important not to miss your dose. Doses are usually every 4 weeks. Call your doctor or health care professional if you are unable to keep an appointment. What may interact with this medicine? Do not take this medicine with any of the following medications: -adalimumab -anakinra -certolizumab -etanercept -golimumab -infliximab -live virus vaccines -rituximab -tocilizumab This medicine may also interact with the following medications: -vaccines This list may not describe all possible interactions. Give your health care provider a list of all the medicines, herbs, non-prescription drugs, or dietary supplements you use. Also tell them if you smoke, drink alcohol, or use illegal drugs. Some items may interact with your medicine. What should I watch for while using this medicine? Visit your doctor for regular check ups while you are taking this medicine. Tell your doctor or healthcare professional if your symptoms do not start to get better or if they get worse. Call your doctor or health care professional if you get a cold or other infection  while receiving this medicine. Do not treat yourself. This medicine may decrease your body's ability to fight infection. Try to avoid being around people who are sick. What side effects may I notice from receiving this medicine? Side effects that you should report to your doctor or health care professional  as soon as possible: -allergic reactions like skin rash, itching or hives, swelling of the face, lips, or tongue -breathing problems -chest pain -signs of infection - fever or chills, cough, unusual tiredness, pain or trouble passing urine, or warm, red or painful skin Side effects that usually do not require medical attention (report to your doctor or health care professional if they continue or are bothersome): -dizziness -headache -nausea, vomiting -sore throat -stomach upset This list may not describe all possible side effects. Call your doctor for medical advice about side effects. You may report side effects to FDA at 1-800-FDA-1088. Where should I keep my medicine? Infusions will be given in a hospital or clinic and will not be stored at home. Storage for syringes given under the skin and stored at home: Keep out of the reach of children. Store in a refrigerator between 2 and 8 degrees C (36 and 46 degrees F). Keep this medicine in the original container. Protect from light. Do not freeze. Throw away any unused medicine after the expiration date. NOTE: This sheet is a summary. It may not cover all possible information. If you have questions about this medicine, talk to your doctor, pharmacist, or health care provider.  2018 Elsevier/Gold Standard (2016-06-22 10:07:35)

## 2017-05-30 NOTE — Telephone Encounter (Signed)
A prior auth was faxed to [facility] for Orencia. Will update once we receive a response.   Xia Stohr 4:35 PM

## 2017-06-05 MED ORDER — ABATACEPT 125 MG/ML ~~LOC~~ SOAJ: 125.0000 mg | SUBCUTANEOUS | 2 refills | Status: DC

## 2017-06-05 NOTE — Telephone Encounter (Signed)
Called [facility] [employer] to verify status of patient's prior authorization. Spoke with Jon Gills who states that the authorization for Dub Amis has been approved from 06/04/17 through 05/30/18.   Reference number: [id number] Phone number: [phone]  Abran Duke, CPhT 4:08 PM

## 2017-06-05 NOTE — Addendum Note (Signed)
Addended by: Chesley Mires on: 06/05/2017 04:46 PM   Modules accepted: Orders

## 2017-06-05 NOTE — Telephone Encounter (Signed)
I informed patient that Dub Amis was approved by her insurance.  Prescription has been sent to her pharmacy and will request initial fill be sent to clinic.  Will schedule nursing visit once we receive initial fill.  Patient reports most recent Enbrel injection was Sunday.  Nursing visit will have to be at least one week after most recent Enbrel.  Patient voiced understanding.    [name], Pharm.D., BCPS, CPP Clinical Pharmacist Pager: [phone] Phone: [phone] 06/05/2017 4:44 PM

## 2017-06-06 MED FILL — ORENCIA CLICKJECT 125 MG/ML: 125 | 28 days supply | Qty: 4 | Fill #0

## 2017-06-07 ENCOUNTER — Telehealth: Payer: Self-pay

## 2017-06-07 NOTE — Telephone Encounter (Signed)
Coordinated delivery of Orencia [employer] from [facility]. Medication has been put in the refrigerator.  Miguel Medal, [place], CPhT 2:00 PM

## 2017-06-07 NOTE — Telephone Encounter (Signed)
Spoke to patient.  Nurse visit scheduled 06/11/17 at 2:00 PM for initial Orenica injection.  This will be at least one week after most recent Enbrel.    [name], Pharm.D., BCPS, CPP Clinical Pharmacist Pager: [phone] Phone: [phone] 06/07/2017 2:06 PM

## 2017-06-11 ENCOUNTER — Telehealth: Payer: Self-pay | Admitting: Pharmacist

## 2017-06-11 ENCOUNTER — Ambulatory Visit: Payer: Medicaid Other

## 2017-06-11 NOTE — Telephone Encounter (Signed)
Patient did not show up for nurse visit for initial Orencia injection today.  I called patient who reports she forgot about the visit.  She asked to reschedule for next week.    Is it ok for patient to come in on Tuesday July 3rd at 10:00 AM?    [name], Pharm.D., BCPS, CPP Clinical Pharmacist Pager: [phone] Phone: [phone] 06/11/2017 4:26 PM

## 2017-06-12 NOTE — Telephone Encounter (Signed)
Yes that will work

## 2017-06-19 ENCOUNTER — Ambulatory Visit (INDEPENDENT_AMBULATORY_CARE_PROVIDER_SITE_OTHER): Payer: Medicaid Other | Admitting: *Deleted

## 2017-06-19 VITALS — BP 106/74 | HR 84

## 2017-06-19 DIAGNOSIS — M0579 Rheumatoid arthritis with rheumatoid factor of multiple sites without organ or systems involvement: Secondary | ICD-10-CM | POA: Diagnosis not present

## 2017-06-19 MED ORDER — ABATACEPT 125 MG/ML ~~LOC~~ SOAJ
125.0000 mg | Freq: Once | SUBCUTANEOUS | Status: AC
  Administered 2017-06-19: 125 mg via SUBCUTANEOUS

## 2017-06-19 NOTE — Progress Notes (Signed)
Patient in office for initial start to Orencia.Patient is switching from Enbrel. Patient states her last Enbrel injection was 2 weeks ago . Patient was given injection in her right thigh and tolerated well. Patient monitored in office for 30 minutes after administration for adverse reactions. No adverse reactions noted.   Administrations This Visit    Abatacept SOAJ 125 mg    Admin Date 06/19/2017 Action Given Dose 125 mg Route Subcutaneous Administered By Henriette Combs, LPN

## 2017-06-26 ENCOUNTER — Other Ambulatory Visit: Payer: Self-pay | Admitting: *Deleted

## 2017-06-26 MED ORDER — METHOTREXATE SODIUM CHEMO INJECTION 50 MG/2ML: 20.0000 mg | INTRAMUSCULAR | 0 refills | Status: DC

## 2017-06-26 NOTE — Telephone Encounter (Signed)
Last Visit: 05/30/17 Next Visit: 07/20/17 Labs: 03/26/17 Anemia  Okay to refill per Dr. Corliss Skains

## 2017-07-04 MED FILL — ORENCIA CLICKJECT 125 MG/ML: 125 | 28 days supply | Qty: 4 | Fill #1

## 2017-07-16 NOTE — Progress Notes (Deleted)
Office Visit Note  Patient: Hayley Burke             Date of Birth: [birth year]           MRN: [id number]             PCP: Wynell Balloon, PA-C Referring: Wynell Balloon, PA* Visit Date: 07/20/2017 Occupation: @GUAROCC @    Subjective:  No chief complaint on file.   History of Present Illness: Hayley Burke is a 31 y.o. female ***   Activities of Daily Living:  Patient reports morning stiffness for *** {minute/hour:19697}.   Patient {ACTIONS;DENIES/REPORTS:21021675::"Denies"} nocturnal pain.  Difficulty dressing/grooming: {ACTIONS;DENIES/REPORTS:21021675::"Denies"} Difficulty climbing stairs: {ACTIONS;DENIES/REPORTS:21021675::"Denies"} Difficulty getting out of chair: {ACTIONS;DENIES/REPORTS:21021675::"Denies"} Difficulty using hands for taps, buttons, cutlery, and/or writing: {ACTIONS;DENIES/REPORTS:21021675::"Denies"}   No Rheumatology ROS completed.   PMFS History:  Patient Active Problem List   Diagnosis Date Noted  . History of anemia 05/29/2017  . 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 High risk medications (not anticoagulants) long-term use 05/03/2017  . Pain in left wrist 05/03/2017  . Trigger finger, right middle finger 05/03/2017    Past Medical History:  Diagnosis Date  . Rheumatoid arthritis (HCC)     No family history on file. Past Surgical History:  Procedure Laterality Date  . CESAREAN SECTION    . TONSILLECTOMY AND ADENOIDECTOMY     Social History   Social History Narrative  . No narrative on file     Objective: Vital Signs: There were no vitals taken for this visit.   Physical Exam   Musculoskeletal Exam: ***  CDAI Exam: No CDAI exam completed.    Investigation: Findings:  TB Gold negative 02/08/17  CBC Latest Ref Rng & Units 03/26/2017 12/06/2016  WBC 3.8 - 10.8 K/uL 6.8 5.8  Hemoglobin 11.7 - 15.5 g/dL 12/08/2016) 10.4(L)  Hematocrit 35.0 - 45.0 %  31.2(L) 33.3(L)  Platelets 140 - 400 K/uL 385 262    CMP Latest Ref Rng & Units 03/26/2017 12/06/2016  Glucose 65 - 99 mg/dL 84 85  BUN 7 - 25 mg/dL 16 12  Creatinine 12/08/2016 - 1.10 mg/dL 5.95 6.38  Sodium 7.56 - 146 mmol/L 142 139  Potassium 3.5 - 5.3 mmol/L 4.1 4.3  Chloride 98 - 110 mmol/L 108 108  CO2 20 - 31 mmol/L 25 23  Calcium 8.6 - 10.2 mg/dL 433) 8.3(L)  Total Protein 6.1 - 8.1 g/dL 6.1 2.9(J)  Total Bilirubin 0.2 - 1.2 mg/dL 0.2 0.3  Alkaline Phos 33 - 115 U/L 56 46  AST 10 - 30 U/L 20 16  ALT 6 - 29 U/L 15 11     Imaging: No results found.  Speciality Comments: No specialty comments available.    Procedures:  No procedures performed Allergies: Sulfa antibiotics   Assessment / Plan:     Visit Diagnoses: Rheumatoid arthritis with rheumatoid factor of multiple sites without organ or systems involvement (HCC)  High risk medications (not anticoagulants) long-term use - Methotrexate and Orencia sub q  Trigger finger, right middle finger  Pain in left wrist  History of anemia    Orders: No orders of the defined types were placed in this encounter.  No orders of the defined types were placed in this encounter.   Face-to-face time spent with patient was *** minutes. 50% of time was spent in counseling and coordination of care.  Follow-Up Instructions: No Follow-up on file.   Sergei Delo, RT  Note -  This record has been created using [employer].  Chart creation errors have been sought, but may not always  have been located. Such creation errors do not reflect on  the standard of medical care.

## 2017-07-20 ENCOUNTER — Ambulatory Visit: Payer: Medicaid Other | Admitting: Rheumatology

## 2017-07-26 MED FILL — ORENCIA CLICKJECT 125 MG/ML: 125 | 28 days supply | Qty: 4 | Fill #2

## 2017-08-17 ENCOUNTER — Other Ambulatory Visit: Payer: Self-pay | Admitting: Rheumatology

## 2017-08-17 DIAGNOSIS — Z79899 Other long term (current) drug therapy: Secondary | ICD-10-CM

## 2017-08-17 MED FILL — ORENCIA CLICKJECT 125 MG/ML: 125 | 28 days supply | Qty: 4 | Fill #0

## 2017-08-17 NOTE — Telephone Encounter (Signed)
Last Visit: 05/30/17 Next Visit was due August 2018. Message sent to front to schedule patient.  Labs: 03/26/17 Anemia TB Gold: 02/08/17  Patient will update labs tomorrow.   Okay to refill 30 day supply per Dr. Corliss Skains

## 2017-08-19 NOTE — Progress Notes (Signed)
Office Visit Note  Patient: Hayley Burke             Date of Birth: 06/20/1986           MRN: [id number]             PCP: Gwendlyn Deutscher, PA-C Referring: Gwendlyn Deutscher, [place]* Visit Date: 08/24/2017 Occupation: '@GUAROCC'$ @    Subjective:  Joint stiffness.   History of Present Illness: Hayley Burke is a 31 y.o. female with history of sero positive rheumatoid arthritis. She was started on Orencia on 06/19/2017. She's been taking weekly Orencia along with methotrexate. She has noticed improvement in her joint symptoms. She denies any joint swelling. She has occasional joint discomfort and some morning stiffness.  Activities of Daily Living:  Patient reports morning stiffness for 5  minutes.   Patient Denies nocturnal pain.  Difficulty dressing/grooming: Denies Difficulty climbing stairs: Denies Difficulty getting out of chair: Denies Difficulty using hands for taps, buttons, cutlery, and/or writing: Denies   Review of Systems  Constitutional: Negative.  Negative for fatigue, night sweats, weight gain, weight loss and weakness.  HENT: Negative for mouth sores, trouble swallowing, trouble swallowing, mouth dryness and nose dryness.   Eyes: Positive for dryness. Negative for pain, redness and visual disturbance.  Respiratory: Negative.  Negative for cough, shortness of breath and difficulty breathing.   Cardiovascular: Negative.  Negative for chest pain, palpitations, hypertension, irregular heartbeat and swelling in legs/feet.  Gastrointestinal: Negative.  Negative for blood in stool, constipation and diarrhea.  Endocrine: Negative.  Negative for increased urination.  Genitourinary: Negative.  Negative for nocturia and vaginal dryness.  Musculoskeletal: Negative for arthralgias, joint pain, joint swelling, myalgias, muscle weakness, morning stiffness, muscle tenderness and myalgias.  Skin: Negative.  Negative for color change, rash, hair loss, skin tightness, ulcers and  sensitivity to sunlight.  Allergic/Immunologic: Negative for susceptible to infections.  Neurological: Negative.  Negative for dizziness, headaches, memory loss and night sweats.  Hematological: Negative.  Negative for swollen glands.  Psychiatric/Behavioral: Negative.  Negative for depressed mood and sleep disturbance. The patient is not nervous/anxious.     PMFS History:  Patient Active Problem List   Diagnosis Date Noted  . History of anemia 05/29/2017  . Rheumatoid arthritis with rheumatoid factor of multiple sites without organ or systems involvement ([place]) 05/03/2017  . High risk medications (not anticoagulants) long-term use 05/03/2017  . Pain in left wrist 05/03/2017  . Trigger finger, right middle finger 05/03/2017    Past Medical History:  Diagnosis Date  . Rheumatoid arthritis ([place])     History reviewed. No pertinent family history. Past Surgical History:  Procedure Laterality Date  . CESAREAN SECTION    . TONSILLECTOMY AND ADENOIDECTOMY     Social History   Social History Narrative  . No narrative on file     Objective: Vital Signs: BP 117/70   Pulse 81   Resp 12   Ht '5\' 5"'$  (1.651 m)   Wt 134 lb (60.8 kg)   BMI 22.30 kg/m    Physical Exam  Constitutional: She is oriented to person, place, and time. She appears well-developed and well-nourished.  HENT:  Head: Normocephalic and atraumatic.  Eyes: Conjunctivae and EOM are normal.  Neck: Normal range of motion.  Cardiovascular: Normal rate, regular rhythm, normal heart sounds and intact distal pulses.   Pulmonary/Chest: Effort normal and breath sounds normal.  Abdominal: Soft. Bowel sounds are normal.  Lymphadenopathy:    She has no cervical adenopathy.  Neurological:  She is alert and oriented to person, place, and time.  Skin: Skin is warm and dry. Capillary refill takes less than 2 seconds.  Psychiatric: She has a normal mood and affect. Her behavior is normal.  Nursing note and vitals reviewed.     Musculoskeletal Exam: C-spine and thoracic lumbar spine good range of motion. Shoulder joints elbow joints wrist joint MCPs PIPs DIPs with good range of motion with no synovitis. Hip joints knee joints ankles MTPs PIPs DIPs are good range of motion with no synovitis. She is some synovial thickening over MCPs and MTP joints.  CDAI Exam: CDAI Homunculus Exam:   Joint Counts:  CDAI Tender Joint count: 0 CDAI Swollen Joint count: 0  Global Assessments:  Patient Global Assessment: 2 Provider Global Assessment: 2  CDAI Calculated Score: 4    Investigation: No additional findings.TB Gold: Negative in 01/2017 CBC Latest Ref Rng & Units 03/26/2017 12/06/2016  WBC 3.8 - 10.8 K/uL 6.8 5.8  Hemoglobin 11.7 - 15.5 g/dL 9.5(L) 10.4(L)  Hematocrit 35.0 - 45.0 % 31.2(L) 33.3(L)  Platelets 140 - 400 K/uL 385 262   CMP Latest Ref Rng & Units 03/26/2017 12/06/2016  Glucose 65 - 99 mg/dL 84 85  BUN 7 - 25 mg/dL 16 12  Creatinine 0.50 - 1.10 mg/dL 0.90 0.73  Sodium 135 - 146 mmol/L 142 139  Potassium 3.5 - 5.3 mmol/L 4.1 4.3  Chloride 98 - 110 mmol/L 108 108  CO2 20 - 31 mmol/L 25 23  Calcium 8.6 - 10.2 mg/dL 8.4(L) 8.3(L)  Total Protein 6.1 - 8.1 g/dL 6.1 5.5(L)  Total Bilirubin 0.2 - 1.2 mg/dL 0.2 0.3  Alkaline Phos 33 - 115 U/L 56 46  AST 10 - 30 U/L 20 16  ALT 6 - 29 U/L 15 11    Imaging: No results found.  Speciality Comments: No specialty comments available.    Procedures:  No procedures performed Allergies: Sulfa antibiotics   Assessment / Plan:     Visit Diagnoses: Rheumatoid arthritis with rheumatoid factor of multiple sites without organ or systems involvement ([place]) - +antiCCP + RF + ANA, high ESR, erosive disease. Patient is clinically doing better with decreased pain and discomfort. She still have intermittent arthralgia and morning stiffness. She is no synovitis on examination.  High risk medication use - MTX 0.8 ml sq qwk, folic acid '2mg'$  po qd, Orencia 125 mg sq q  week(06/19/2017).( Failed Humira and Enbrel) -her labs are past-due. The need to have labs on him the fashion was emphasized. Monitoring of medication to evaluate for toxicity was discussed. We will check her labs today and then every 3 months to monitor. Plan: COMPLETE METABOLIC PANEL WITH GFR, CBC with Differential/Platelet, CBC with Differential/Platelet, COMPLETE METABOLIC PANEL WITH GFR  Trigger finger, left middle finger - not interested in injection. I've advised her to was/the finger and if her symptoms persist we may consider an injection in future.  History of anemia - advised to follow up with PCP at last visit.     Orders: Orders Placed This Encounter  Procedures  . CBC with Differential/Platelet  . COMPLETE METABOLIC PANEL WITH GFR   No orders of the defined types were placed in this encounter.    Follow-Up Instructions: Return in about 5 months (around 01/24/2018) for Rheumatoid arthritis.   Bo Merino, MD  Note - This record has been created using [employer].  Chart creation errors have been sought, but may not always  have been located. Such creation errors  do not reflect on  the standard of medical care.

## 2017-08-24 ENCOUNTER — Ambulatory Visit (INDEPENDENT_AMBULATORY_CARE_PROVIDER_SITE_OTHER): Payer: Medicaid Other | Admitting: Rheumatology

## 2017-08-24 ENCOUNTER — Encounter: Payer: Self-pay | Admitting: Rheumatology

## 2017-08-24 VITALS — BP 117/70 | HR 81 | Resp 12 | Ht 65.0 in | Wt 134.0 lb

## 2017-08-24 DIAGNOSIS — M0579 Rheumatoid arthritis with rheumatoid factor of multiple sites without organ or systems involvement: Secondary | ICD-10-CM | POA: Diagnosis not present

## 2017-08-24 DIAGNOSIS — M65332 Trigger finger, left middle finger: Secondary | ICD-10-CM

## 2017-08-24 DIAGNOSIS — Z862 Personal history of diseases of the blood and blood-forming organs and certain disorders involving the immune mechanism: Secondary | ICD-10-CM

## 2017-08-24 DIAGNOSIS — Z79899 Other long term (current) drug therapy: Secondary | ICD-10-CM

## 2017-08-24 LAB — CBC WITH DIFFERENTIAL/PLATELET
Basophils Absolute: 53 cells/uL (ref 0–200)
Basophils Relative: 0.8 %
EOS PCT: 7.6 %
Eosinophils Absolute: 502 cells/uL — ABNORMAL HIGH (ref 15–500)
HCT: 36.3 % (ref 35.0–45.0)
Hemoglobin: 11.9 g/dL (ref 11.7–15.5)
Lymphs Abs: 2020 cells/uL (ref 850–3900)
MCH: 30.7 pg (ref 27.0–33.0)
MCHC: 32.8 g/dL (ref 32.0–36.0)
MCV: 93.8 fL (ref 80.0–100.0)
MPV: 12.2 fL (ref 7.5–12.5)
Monocytes Relative: 6.5 %
NEUTROS PCT: 54.5 %
Neutro Abs: 3597 cells/uL (ref 1500–7800)
PLATELETS: 295 10*3/uL (ref 140–400)
RBC: 3.87 10*6/uL (ref 3.80–5.10)
RDW: 13.5 % (ref 11.0–15.0)
TOTAL LYMPHOCYTE: 30.6 %
WBC mixed population: 429 cells/uL (ref 200–950)
WBC: 6.6 10*3/uL (ref 3.8–10.8)

## 2017-08-24 LAB — COMPLETE METABOLIC PANEL WITHOUT GFR
AG Ratio: 1.8 (calc) (ref 1.0–2.5)
ALT: 17 U/L (ref 6–29)
AST: 17 U/L (ref 10–30)
Albumin: 3.9 g/dL (ref 3.6–5.1)
Alkaline phosphatase (APISO): 55 U/L (ref 33–115)
BUN: 13 mg/dL (ref 7–25)
CO2: 27 mmol/L (ref 20–32)
Calcium: 8.9 mg/dL (ref 8.6–10.2)
Chloride: 107 mmol/L (ref 98–110)
Creat: 0.78 mg/dL (ref 0.50–1.10)
GFR, Est African American: 117 mL/min/1.73m2
GFR, Est Non African American: 101 mL/min/1.73m2
Globulin: 2.2 g/dL (ref 1.9–3.7)
Glucose, Bld: 74 mg/dL (ref 65–99)
Potassium: 4.3 mmol/L (ref 3.5–5.3)
Sodium: 141 mmol/L (ref 135–146)
Total Bilirubin: 0.3 mg/dL (ref 0.2–1.2)
Total Protein: 6.1 g/dL (ref 6.1–8.1)

## 2017-08-24 LAB — SPECIMEN ID NOTIFICATION MISSING 2ND ID

## 2017-08-24 NOTE — Patient Instructions (Signed)
Standing Labs We placed an order today for your standing lab work.    Please come back and get your standing labs in December and every 3 months  We have open lab Monday through Friday from 8:30-11:30 AM and 1:30-4 PM at the office of Dr. Dimitri Dsouza.   The office is located at [address], [address], [place], [place] [address] No appointment is necessary.   Labs are drawn by [facility].  You may receive a bill from [facility] for your lab work. If you have any questions regarding directions or hours of operation,  please call [phone].    

## 2017-08-25 NOTE — Progress Notes (Signed)
stable °

## 2017-09-26 ENCOUNTER — Telehealth: Payer: Self-pay | Admitting: Rheumatology

## 2017-09-26 MED ORDER — METHOTREXATE SODIUM CHEMO INJECTION 50 MG/2ML: 20.0000 mg | INTRAMUSCULAR | 0 refills | Status: DC

## 2017-09-26 NOTE — Telephone Encounter (Signed)
Patient needs a refill on MTX. Patient uses [facility]. Patient would also like the phone number for [facility] online Pharmacy for Orencia. Please call patient to inform.

## 2017-09-26 NOTE — Telephone Encounter (Signed)
Last Visit: 08/24/17 Next Visit due in February 2019. Message to front to schedule patient.  Labs: 08/24/17 stable  Okay to refill per Dr.Deveshwar  Patient advised prescription sent to the pharmacy. Patient provided with number for [facility] oupatient phone number.

## 2017-09-27 ENCOUNTER — Other Ambulatory Visit: Payer: Self-pay | Admitting: Rheumatology

## 2017-09-27 MED ORDER — ABATACEPT 125 MG/ML ~~LOC~~ SOAJ: 125.0000 mg | SUBCUTANEOUS | 2 refills | Status: DC

## 2017-09-27 MED FILL — ORENCIA CLICKJECT 125 MG/ML: 125 | 28 days supply | Qty: 4 | Fill #0

## 2017-09-27 NOTE — Telephone Encounter (Signed)
Last Visit: 08/24/17 Next Visit: 02/05/18 Labs: 08/24/17 stable TB Gold: 02/08/17 Neg  Okay to refill per Dr. Corliss Skains.   Patient advised prescription sent to the pharmacy. Patient states she may be starting her first flare since starting the Orencia. Patient states the pain isn't unbearable. Patient will take her Orencia and cal back if pain gets worse.

## 2017-09-27 NOTE — Telephone Encounter (Signed)
Patient called to request refill of Orencia to be sent to [facility] outpatient pharmacy. Patient states she is due for her injection today and her joints are very achy. Patient states she thinks she may be started to flare due to the weather because she hasn't had any problems lately.

## 2017-10-03 ENCOUNTER — Telehealth: Payer: Self-pay

## 2017-10-03 NOTE — Telephone Encounter (Signed)
Received a prior authorization request for MTX injectable from [facility]. Called [employer] to initiate an authorization over the phone. Spoke with representative, Rosanne Ashing, who states that the plan does not require a prior authorization for this medication. He showed a paid claim on 10/01/17 completed at the pharmacy.   Reference number: [id number] Phone number: [phone]  Called pharmacy to verify. Spoke with Marchelle Folks who states that the patient's rx was processed as a paid claim and the medication was mailed to her home.   Jeral Zick, [place], CPhT 10:16 AM

## 2017-10-18 MED FILL — ORENCIA CLICKJECT 125 MG/ML: 125 | 28 days supply | Qty: 4 | Fill #1

## 2017-11-15 MED FILL — ORENCIA CLICKJECT 125 MG/ML: 125 | 28 days supply | Qty: 4 | Fill #2

## 2017-12-04 ENCOUNTER — Other Ambulatory Visit: Payer: Self-pay | Admitting: Rheumatology

## 2017-12-05 MED FILL — ORENCIA CLICKJECT 125 MG/ML: 125 | 28 days supply | Qty: 4 | Fill #0

## 2017-12-05 NOTE — Telephone Encounter (Signed)
Last Visit: 08/24/17 Next Visit: 02/05/18 Labs: 08/24/17 stable TB Gold: 02/08/17 Neg  Left message to advise patient she is due for labs.   Okay to refill 30 day supply per Dr. Corliss Skains

## 2017-12-06 ENCOUNTER — Other Ambulatory Visit: Payer: Self-pay

## 2017-12-06 DIAGNOSIS — Z79899 Other long term (current) drug therapy: Secondary | ICD-10-CM

## 2017-12-06 LAB — COMPLETE METABOLIC PANEL WITH GFR
AG RATIO: 1.8 (calc) (ref 1.0–2.5)
ALKALINE PHOSPHATASE (APISO): 54 U/L (ref 33–115)
ALT: 12 U/L (ref 6–29)
AST: 16 U/L (ref 10–30)
Albumin: 3.7 g/dL (ref 3.6–5.1)
BILIRUBIN TOTAL: 0.3 mg/dL (ref 0.2–1.2)
BUN: 13 mg/dL (ref 7–25)
CHLORIDE: 106 mmol/L (ref 98–110)
CO2: 26 mmol/L (ref 20–32)
Calcium: 8.6 mg/dL (ref 8.6–10.2)
Creat: 0.7 mg/dL (ref 0.50–1.10)
GFR, Est African American: 133 mL/min/{1.73_m2} (ref >=60)
GFR, Est Non African American: 115 mL/min/{1.73_m2} (ref >=60)
Globulin: 2.1 g/dL (calc) (ref 1.9–3.7)
Glucose, Bld: 75 mg/dL (ref 65–99)
POTASSIUM: 4.1 mmol/L (ref 3.5–5.3)
Sodium: 140 mmol/L (ref 135–146)
Total Protein: 5.8 g/dL — ABNORMAL LOW (ref 6.1–8.1)

## 2017-12-06 LAB — CBC WITH DIFFERENTIAL/PLATELET
BASOS ABS: 41 {cells}/uL (ref 0–200)
Basophils Relative: 0.4 %
EOS PCT: 11.6 %
Eosinophils Absolute: 1195 cells/uL — ABNORMAL HIGH (ref 15–500)
HCT: 34.7 % — ABNORMAL LOW (ref 35.0–45.0)
HEMOGLOBIN: 11.5 g/dL — AB (ref 11.7–15.5)
Lymphs Abs: 1998 cells/uL (ref 850–3900)
MCH: 30.2 pg (ref 27.0–33.0)
MCHC: 33.1 g/dL (ref 32.0–36.0)
MCV: 91.1 fL (ref 80.0–100.0)
MONOS PCT: 5.3 %
MPV: 12.4 fL (ref 7.5–12.5)
NEUTROS ABS: 6520 {cells}/uL (ref 1500–7800)
Neutrophils Relative %: 63.3 %
Platelets: 296 10*3/uL (ref 140–400)
RBC: 3.81 10*6/uL (ref 3.80–5.10)
RDW: 15.6 % — ABNORMAL HIGH (ref 11.0–15.0)
Total Lymphocyte: 19.4 %
WBC mixed population: 546 cells/uL (ref 200–950)
WBC: 10.3 10*3/uL (ref 3.8–10.8)

## 2017-12-07 NOTE — Progress Notes (Signed)
stable °

## 2017-12-14 ENCOUNTER — Other Ambulatory Visit: Payer: Self-pay | Admitting: Rheumatology

## 2017-12-14 NOTE — Telephone Encounter (Signed)
Last Visit: 08/24/17 Next Visit: 02/05/18 Labs: 12/06/17 stable  Okay to refill per Dr. Corliss Skains

## 2017-12-31 ENCOUNTER — Other Ambulatory Visit: Payer: Self-pay | Admitting: Rheumatology

## 2017-12-31 NOTE — Telephone Encounter (Signed)
Last Visit: 08/24/17 Next Visit: 02/05/18 Labs: 12/06/17 Stable TB Gold:  02/08/17 Neg  Okay to refill per Dr. Corliss Skains

## 2018-01-05 IMAGING — DX DG CHEST 2V
2 series · 2 of 2 positions shown · non-contrast
Comparison: None.

CLINICAL DATA: Rheumatoid arthritis, pre therapy for
immunosuppression.

EXAM:
CHEST  2 VIEW

[chest pa]
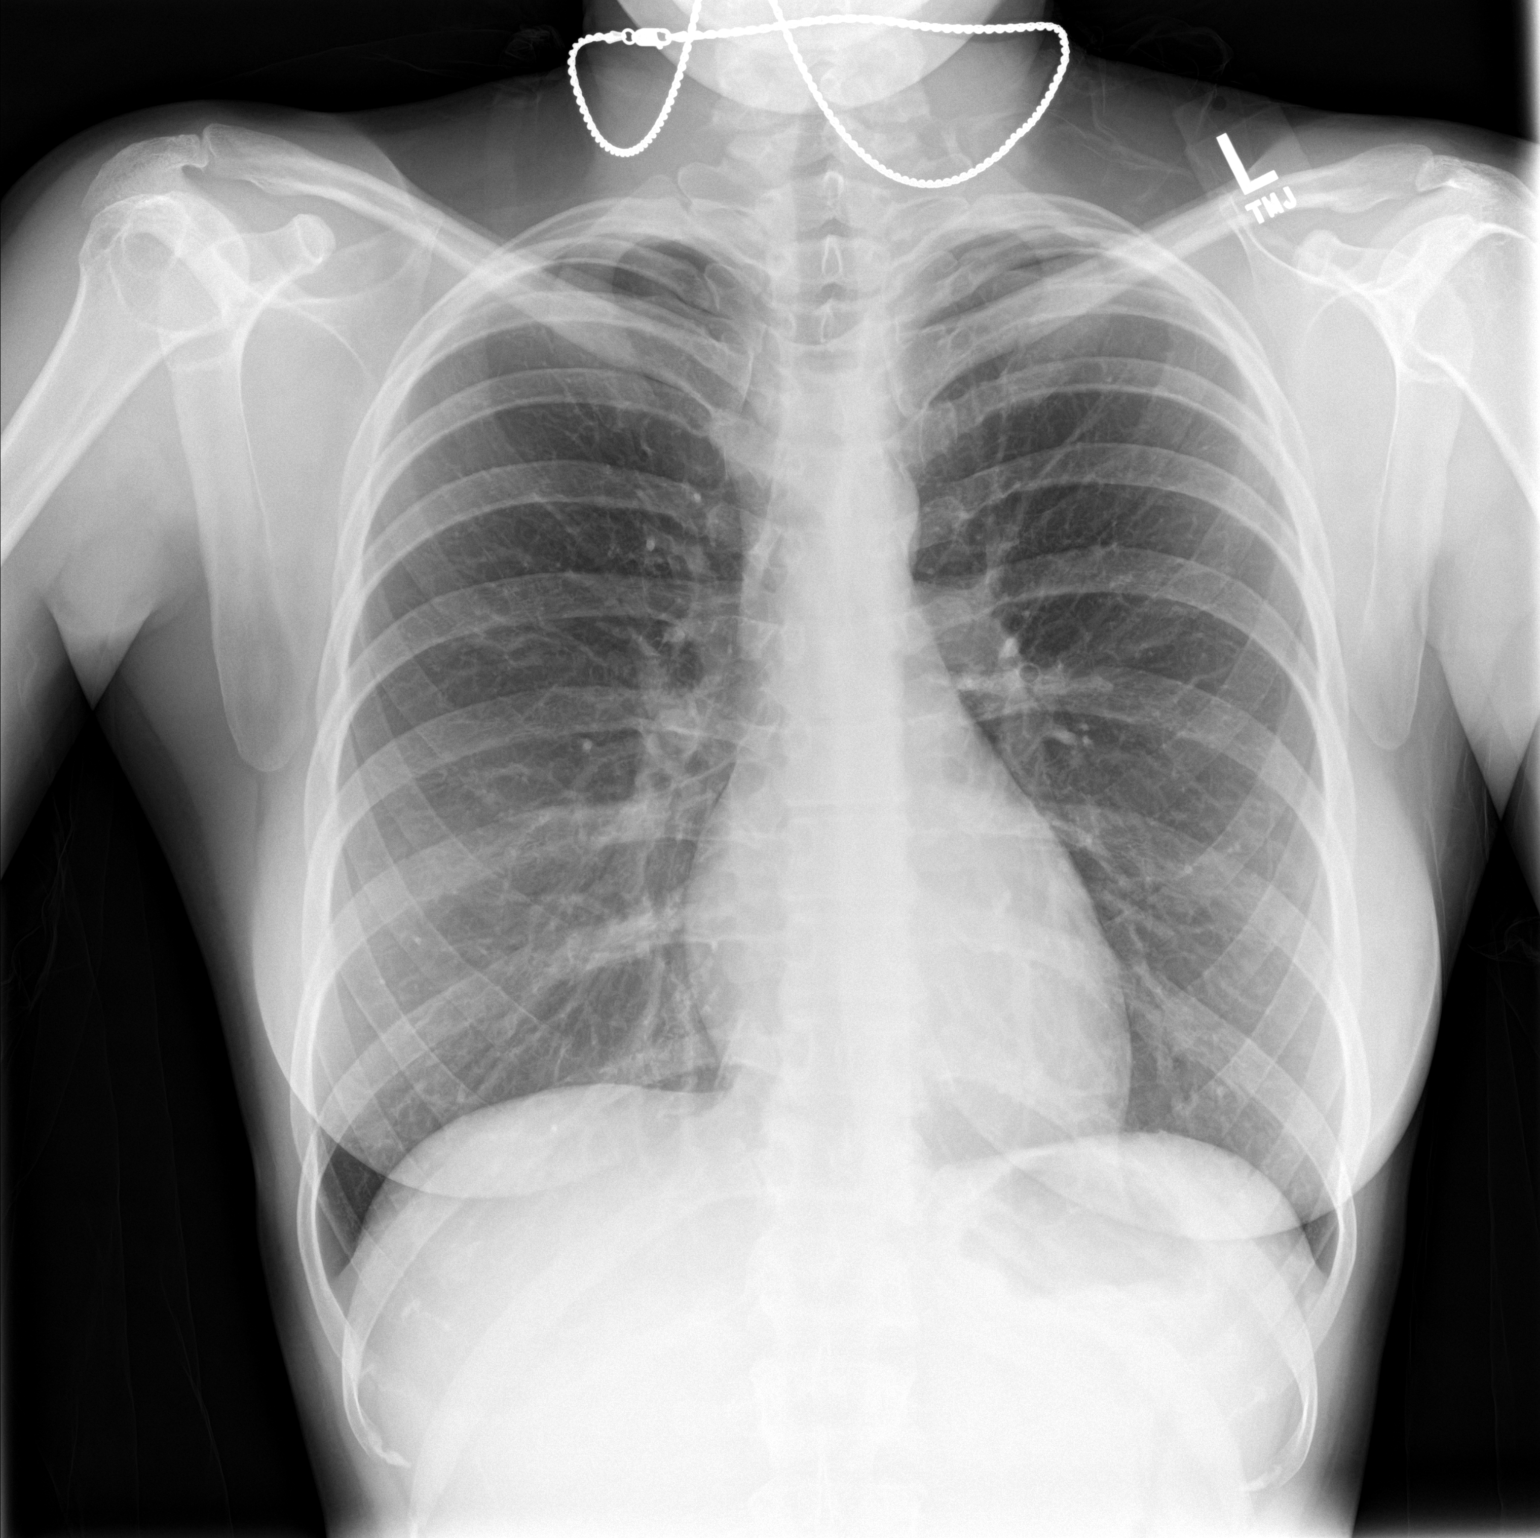

[chest lat]
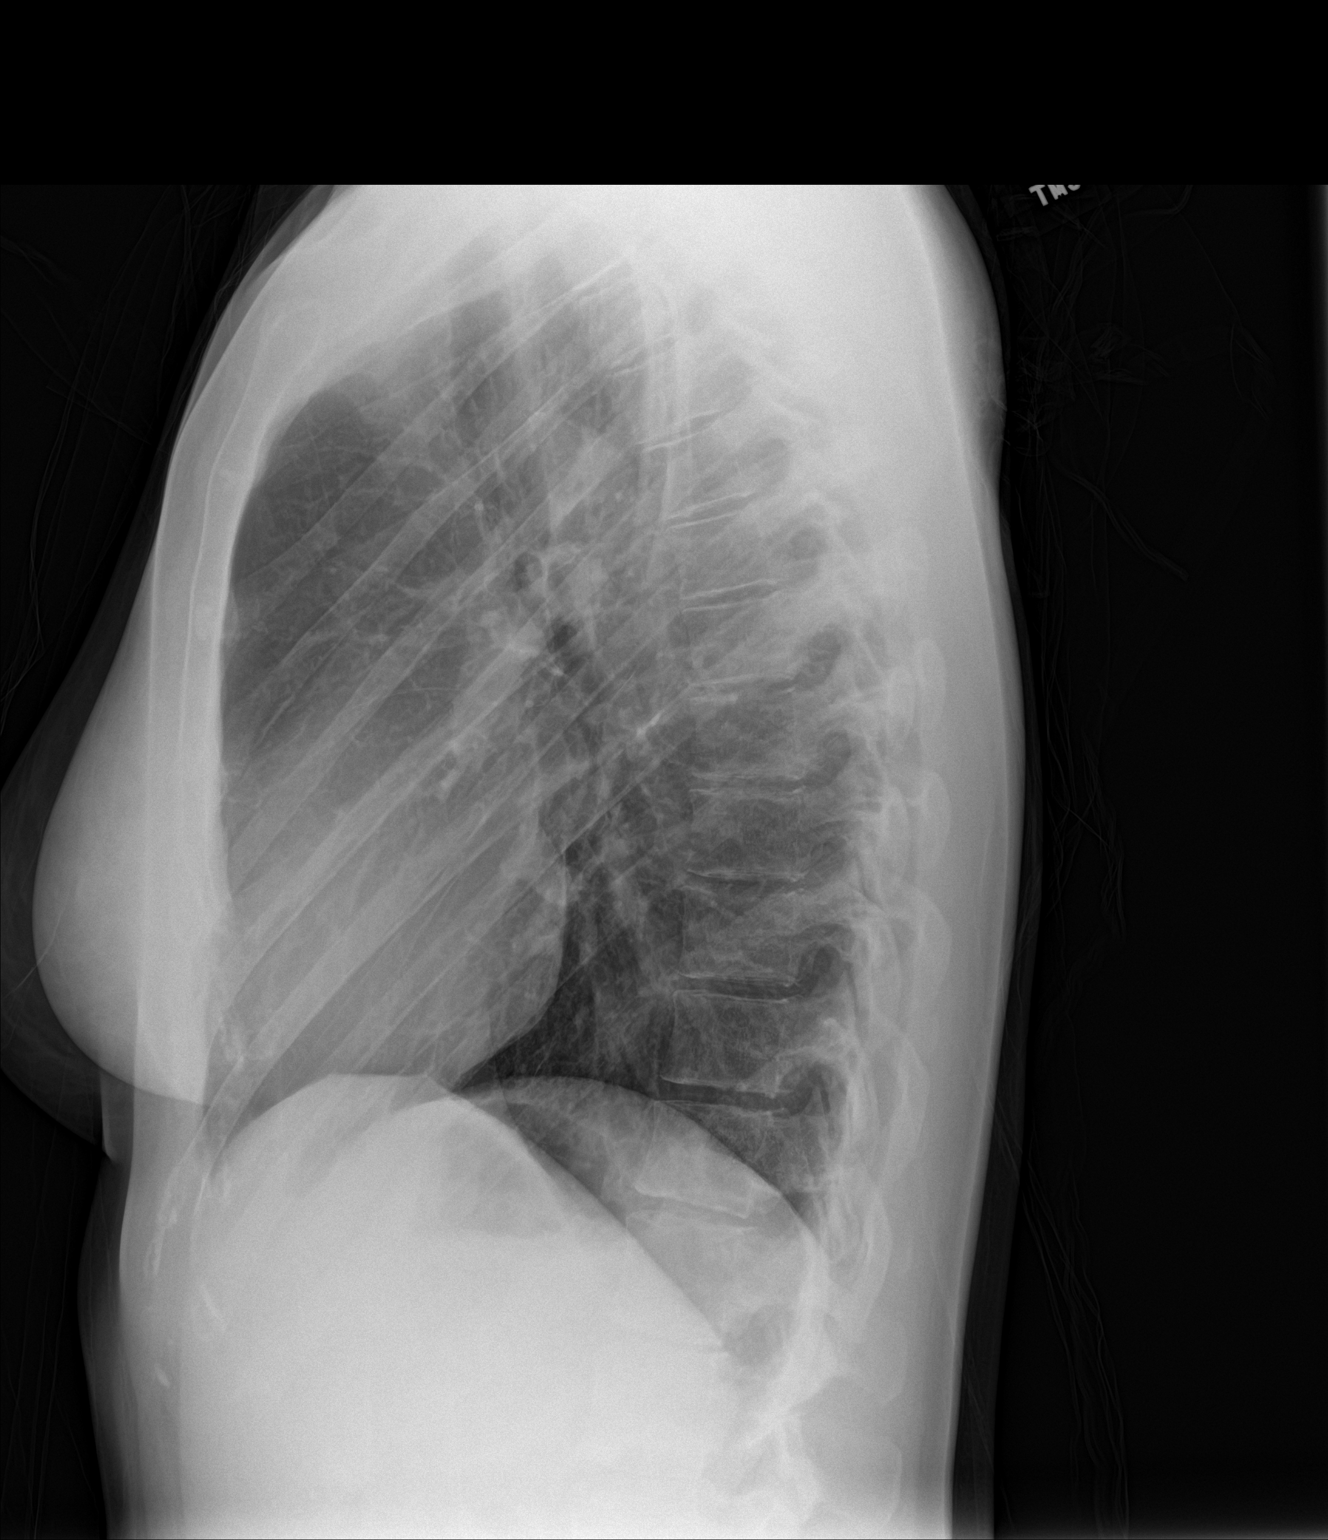

[2 of 2 positions shown; findings below may reference images not displayed]

FINDINGS: The heart size and mediastinal contours are within normal limits.
Both lungs are clear. The visualized skeletal structures are
unremarkable.
IMPRESSION: No active cardiopulmonary disease.

## 2018-01-10 MED FILL — ORENCIA CLICKJECT 125 MG/ML: 125 | 28 days supply | Qty: 4 | Fill #0

## 2018-01-18 NOTE — Progress Notes (Signed)
Office Visit Note  Patient: Hayley Burke             Date of Birth: 1986-08-09           MRN: [id number]             PCP: Wynell Balloon, PA-C Referring: Wynell Balloon, [place]* Visit Date: 01/22/2018 Occupation: @GUAROCC @    Subjective:  Medication monitoring    History of Present Illness: Hayley Burke is a 32 y.o. female with history of seropositive rheumatoid arthritis.  Patient states she continues to be on Orencia subq and MTX subq.  She needs a refill of folic acid.  She states that she was recently sick with a URI and GI infection, and she missed one dose of both medications.  She denies a flare, but she did have some increased joint stiffness in her right elbow.  She states that she normally is stiff in the morning for about 1 hour.  She reports the morning stiffness has improved since being on Orencia.  She denies any joint swelling.       Activities of Daily Living:  Patient reports morning stiffness for 1-2 hour.   Patient Denies nocturnal pain.  Difficulty dressing/grooming: Denies Difficulty climbing stairs: Denies Difficulty getting out of chair: Denies Difficulty using hands for taps, buttons, cutlery, and/or writing: Reports   Review of Systems  Constitutional: Positive for fatigue. Negative for weakness.  HENT: Positive for mouth sores. Negative for mouth dryness and nose dryness.   Eyes: Positive for dryness. Negative for pain, redness and visual disturbance.  Respiratory: Negative for cough, hemoptysis, shortness of breath and difficulty breathing.   Cardiovascular: Negative for chest pain, palpitations, hypertension, irregular heartbeat and swelling in legs/feet.  Gastrointestinal: Negative for blood in stool, constipation and diarrhea.  Endocrine: Negative for increased urination.  Genitourinary: Negative for painful urination.  Musculoskeletal: Negative for arthralgias, joint pain, joint swelling, myalgias, muscle weakness, morning stiffness, muscle  tenderness and myalgias.  Skin: Negative for color change, pallor, rash, hair loss, nodules/bumps, redness, skin tightness, ulcers and sensitivity to sunlight.  Allergic/Immunologic: Negative for susceptible to infections.  Neurological: Negative for dizziness, numbness and headaches.  Hematological: Negative for swollen glands.  Psychiatric/Behavioral: Negative for depressed mood and sleep disturbance. The patient is not nervous/anxious.     PMFS History:  Patient Active Problem List   Diagnosis Date Noted  . History of anemia 05/29/2017  . 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 High risk medications (not anticoagulants) long-term use 05/03/2017  . Pain in left wrist 05/03/2017  . Trigger finger, right middle finger 05/03/2017    Past Medical History:  Diagnosis Date  . Rheumatoid arthritis (HCC)     History reviewed. No pertinent family history. Past Surgical History:  Procedure Laterality Date  . CESAREAN SECTION    . TONSILLECTOMY AND ADENOIDECTOMY     Social History   Social History Narrative  . Not on file     Objective: Vital Signs: BP 123/72 (BP Location: Left Arm, Patient Position: Sitting, Cuff Size: Normal)   Pulse (!) 102   Resp 15   Ht 5\' 5"  (1.651 m)   Wt 138 lb (62.6 kg)   BMI 22.96 kg/m    Physical Exam  Constitutional: She is oriented to person, place, and time. She appears well-developed and well-nourished.  HENT:  Head: Normocephalic and atraumatic.  Eyes: Conjunctivae and EOM are normal.  Neck: Normal range of  motion.  Cardiovascular: Normal rate, regular rhythm, normal heart sounds and intact distal pulses.  Pulmonary/Chest: Effort normal and breath sounds normal.  Abdominal: Soft. Bowel sounds are normal.  Lymphadenopathy:    She has no cervical adenopathy.  Neurological: She is alert and oriented to person, place, and time.  Skin: Skin is warm and dry.  Capillary refill takes less than 2 seconds.  Psychiatric: She has a normal mood and affect. Her behavior is normal.  Nursing note and vitals reviewed.    Musculoskeletal Exam: C-spine, thoracic, and lumbar good ROM.  Shoulder joints, elbow joints, wrist joints, MCPs, PIPs, and DIPs good ROM with no synovitis.  She has mild DIP prominence.  Her right elbow does not appear inflamed.  She has no tenderness of her MCPs.  Hip joints, knee joints, ankle joints, MTPs, PIPs, and DIPs good ROM with no synovitis.  She has no warmth or effusion of her knees.  She has crepitus of her knees.  No midline spinal tenderness.  No SI joint tenderness.  No trochanteric bursitis.     CDAI Exam: CDAI Homunculus Exam:   Joint Counts:  CDAI Tender Joint count: 0 CDAI Swollen Joint count: 0  Global Assessments:  Patient Global Assessment: 2 Provider Global Assessment: 2  CDAI Calculated Score: 4    Investigation: No additional findings. CBC Latest Ref Rng & Units 12/06/2017 08/24/2017 03/26/2017  WBC 3.8 - 10.8 Thousand/uL 10.3 6.6 6.8  Hemoglobin 11.7 - 15.5 g/dL 11.5(L) 11.9 9.5(L)  Hematocrit 35.0 - 45.0 % 34.7(L) 36.3 31.2(L)  Platelets 140 - 400 Thousand/uL 296 295 385   CMP Latest Ref Rng & Units 12/06/2017 08/24/2017 03/26/2017  Glucose 65 - 99 mg/dL 75 74 84  BUN 7 - 25 mg/dL 13 13 16   Creatinine 0.50 - 1.10 mg/dL 4.48 1.85  Sodium 135 - 146 mmol/L 140 141 142  Potassium 3.5 - 5.3 mmol/L 4.1 4.3 4.1  Chloride 98 - 110 mmol/L 106 107 108  CO2 20 - 32 mmol/L 26 27 25   Calcium 8.6 - 10.2 mg/dL 8.6 8.9 6.31)  Total Protein 6.1 - 8.1 g/dL ) 6.1 6.1  Total Bilirubin 0.2 - 1.2 mg/dL 0.3 0.3 0.2  Alkaline Phos 33 - 115 U/L - - 56  AST 10 - 30 U/L 16 17 20   ALT 6 - 29 U/L 12 17 15     Imaging: No results found.  Speciality Comments: No specialty comments available.    Procedures:  No procedures performed Allergies: Sulfa antibiotics   Assessment / Plan:     Visit Diagnoses:  Rheumatoid arthritis with rheumatoid factor of multiple sites without organ or systems involvement [facility]): She has no synovitis on exam.  She will continue on Orencia subq and MTX subq.  A refill for folic acid 2 mg tablets was sent to the pharmacy.  She is clinically doing well on this treatment regimen and has decreased morning stiffness.     Trigger finger, right middle finger: Improved.  She has no discomfort at this time.  She states the finger triggers a lot less frequently.    High risk medications (not anticoagulants) long-term use - Orencia, MTX, folic acid 2 mg- CBC and CMP were drawn on 12/06/17. Routine CBC and CMP due in March and every 3 months.  Order for TB gold is in place for her to get with her next lab work in March.   History of anemia: She is taking ferrous sulfate.  PCP is monitoring.  Orders: No orders of the defined types were placed in this encounter.  Meds ordered this encounter  Medications  . folic acid (FOLVITE) 1 MG tablet    Sig: Take 2 tablets (2 mg total) by mouth daily.    Dispense:  60 tablet    Refill:  3      Follow-Up Instructions: Return in about 5 months (around 06/21/2018) for Rheumatoid arthritis.   Gearldine Bienenstock, PA-C  Note - This record has been created using [employer] software.  Chart creation errors have been sought, but may not always  have been located. Such creation errors do not reflect on  the standard of medical care.

## 2018-01-22 ENCOUNTER — Ambulatory Visit (INDEPENDENT_AMBULATORY_CARE_PROVIDER_SITE_OTHER): Payer: Medicaid Other | Admitting: Physician Assistant

## 2018-01-22 ENCOUNTER — Encounter: Payer: Self-pay | Admitting: Physician Assistant

## 2018-01-22 VITALS — BP 123/72 | HR 102 | Resp 15 | Ht 65.0 in | Wt 138.0 lb

## 2018-01-22 DIAGNOSIS — M65331 Trigger finger, right middle finger: Secondary | ICD-10-CM

## 2018-01-22 DIAGNOSIS — M0579 Rheumatoid arthritis with rheumatoid factor of multiple sites without organ or systems involvement: Secondary | ICD-10-CM | POA: Diagnosis not present

## 2018-01-22 DIAGNOSIS — Z79899 Other long term (current) drug therapy: Secondary | ICD-10-CM | POA: Diagnosis not present

## 2018-01-22 DIAGNOSIS — Z862 Personal history of diseases of the blood and blood-forming organs and certain disorders involving the immune mechanism: Secondary | ICD-10-CM | POA: Diagnosis not present

## 2018-01-22 MED ORDER — FOLIC ACID 1 MG PO TABS: 2.0000 mg | ORAL_TABLET | Freq: Every day | ORAL | 3 refills | Status: DC

## 2018-01-22 NOTE — Patient Instructions (Signed)
Standing Labs We placed an order today for your standing lab work.    Please come back and get your standing labs in march and every 3 months  We have open lab Monday through Friday from 8:30-11:30 AM and 1:30-4 PM at the office of Dr. Shaili Deveshwar.   The office is located at [address], [address], [place], [place] [address] No appointment is necessary.   Labs are drawn by [facility].  You may receive a bill from [facility] for your lab work. If you have any questions regarding directions or hours of operation,  please call [phone].    

## 2018-01-25 ENCOUNTER — Ambulatory Visit: Payer: Medicaid Other | Admitting: Rheumatology

## 2018-02-05 ENCOUNTER — Ambulatory Visit: Payer: Medicaid Other | Admitting: Rheumatology

## 2018-02-11 MED FILL — ORENCIA CLICKJECT 125 MG/ML: 125 | 28 days supply | Qty: 4 | Fill #1

## 2018-02-15 ENCOUNTER — Telehealth: Payer: Self-pay | Admitting: Rheumatology

## 2018-02-15 MED ORDER — PREDNISONE 5 MG PO TABS: ORAL_TABLET | ORAL | 0 refills | Status: DC

## 2018-02-15 NOTE — Telephone Encounter (Signed)
Patient is having a flare with rt shoulder/ upper arm now. Patient states rx for Prednisone was discussed at last ov. Patient would like to discuss this if an options. Patient uses [facility] on [address].

## 2018-02-15 NOTE — Telephone Encounter (Signed)
Ok to send in a taper for Prednisone 20 mg, taper by 5 mg every 2 days.  If she continues to flare after completing the taper, please advise her to make a follow up appointment.

## 2018-02-15 NOTE — Telephone Encounter (Signed)
Left message to advise patient prednisone taper has been called to the pharmacy and to contact the office for follow up if she continues to flare.

## 2018-03-01 ENCOUNTER — Encounter: Payer: Self-pay | Admitting: Hematology

## 2018-03-04 NOTE — Progress Notes (Deleted)
Office Visit Note  Patient: Hayley Burke             Date of Birth: 04-01-1986           MRN: [id number]             PCP: Wynell Balloon, PA-C Referring: Wynell Balloon, PA* Visit Date: 03/05/2018 Occupation: @GUAROCC @    Subjective:  No chief complaint on file.   History of Present Illness: Hayley Burke is a 32 y.o. female ***   Activities of Daily Living:  Patient reports morning stiffness for *** {minute/hour:19697}.   Patient {ACTIONS;DENIES/REPORTS:21021675::"Denies"} nocturnal pain.  Difficulty dressing/grooming: {ACTIONS;DENIES/REPORTS:21021675::"Denies"} Difficulty climbing stairs: {ACTIONS;DENIES/REPORTS:21021675::"Denies"} Difficulty getting out of chair: {ACTIONS;DENIES/REPORTS:21021675::"Denies"} Difficulty using hands for taps, buttons, cutlery, and/or writing: {ACTIONS;DENIES/REPORTS:21021675::"Denies"}   No Rheumatology ROS completed.   PMFS History:  Patient Active Problem List   Diagnosis Date Noted  . History of anemia 05/29/2017  . 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 High risk medications (not anticoagulants) long-term use 05/03/2017  . Pain in left wrist 05/03/2017  . Trigger finger, right middle finger 05/03/2017    Past Medical History:  Diagnosis Date  . Rheumatoid arthritis (HCC)     No family history on file. Past Surgical History:  Procedure Laterality Date  . CESAREAN SECTION    . TONSILLECTOMY AND ADENOIDECTOMY     Social History   Social History Narrative  . Not on file     Objective: Vital Signs: There were no vitals taken for this visit.   Physical Exam   Musculoskeletal Exam: ***  CDAI Exam: No CDAI exam completed.    Investigation: No additional findings. CBC Latest Ref Rng & Units 12/06/2017 08/24/2017 03/26/2017  WBC 3.8 - 10.8 Thousand/uL 10.3 6.6 6.8  Hemoglobin 11.7 - 15.5 g/dL 11.5(L) 11.9 9.5(L)  Hematocrit 35.0 - 45.0 %  34.7(L) 36.3 31.2(L)  Platelets 140 - 400 Thousand/uL 296 295 385   CMP Latest Ref Rng & Units 12/06/2017 08/24/2017 03/26/2017  Glucose 65 - 99 mg/dL 75 74 84  BUN 7 - 25 mg/dL 13 13 16   Creatinine 0.50 - 1.10 mg/dL 05/26/2017 3.71  Sodium 135 - 146 mmol/L 140 141 142  Potassium 3.5 - 5.3 mmol/L 4.1 4.3 4.1  Chloride 98 - 110 mmol/L 106 107 108  CO2 20 - 32 mmol/L 26 27 25   Calcium 8.6 - 10.2 mg/dL 8.6 8.9 6.96)  Total Protein 6.1 - 8.1 g/dL 7.89) 6.1 6.1  Total Bilirubin 0.2 - 1.2 mg/dL 0.3 0.3 0.2  Alkaline Phos 33 - 115 U/L - - 56  AST 10 - 30 U/L 16 17 20   ALT 6 - 29 U/L 12 17 15     Imaging: No results found.  Speciality Comments: No specialty comments available.    Procedures:  No procedures performed Allergies: Sulfa antibiotics   Assessment / Plan:     Visit Diagnoses: Rheumatoid arthritis with rheumatoid factor of multiple sites without organ or systems involvement (HCC)  High risk medications (not anticoagulants) long-term use - Orencia, MTX, folic acid TB gold due CBC/CMP: due  Trigger finger, right middle finger  History of anemia    Orders: No orders of the defined types were placed in this encounter.  No orders of the defined types were placed in this encounter.   Face-to-face time spent with patient was *** minutes. 50% of time was spent in counseling and coordination of care.  Follow-Up Instructions:  No Follow-up on file.   Ofilia Neas, PA-C  Note - This record has been created using Dragon software.  Chart creation errors have been sought, but may not always  have been located. Such creation errors do not reflect on  the standard of medical care.

## 2018-03-05 ENCOUNTER — Ambulatory Visit: Payer: Medicaid Other | Admitting: Physician Assistant

## 2018-03-06 ENCOUNTER — Encounter: Payer: Self-pay | Admitting: Physician Assistant

## 2018-03-06 ENCOUNTER — Telehealth: Payer: Self-pay

## 2018-03-06 ENCOUNTER — Ambulatory Visit (INDEPENDENT_AMBULATORY_CARE_PROVIDER_SITE_OTHER): Payer: Medicaid Other | Admitting: Rheumatology

## 2018-03-06 VITALS — BP 119/59 | HR 86 | Resp 15 | Ht 65.0 in | Wt 144.5 lb

## 2018-03-06 DIAGNOSIS — Z862 Personal history of diseases of the blood and blood-forming organs and certain disorders involving the immune mechanism: Secondary | ICD-10-CM

## 2018-03-06 DIAGNOSIS — Z79899 Other long term (current) drug therapy: Secondary | ICD-10-CM | POA: Diagnosis not present

## 2018-03-06 DIAGNOSIS — M0579 Rheumatoid arthritis with rheumatoid factor of multiple sites without organ or systems involvement: Secondary | ICD-10-CM | POA: Diagnosis not present

## 2018-03-06 DIAGNOSIS — M65331 Trigger finger, right middle finger: Secondary | ICD-10-CM | POA: Diagnosis not present

## 2018-03-06 LAB — TEST AUTHORIZATION

## 2018-03-06 LAB — QUANTIFERON-TB GOLD PLUS

## 2018-03-06 NOTE — Telephone Encounter (Signed)
Was asked to submit a prior authorization for Hayley Burke for pt. Fax the authorization request for Hayley Burke to Hayley Burke. Will update once we receive a response.   Hayley Burke, Hayley Burke, Hayley Burke 5:19 PM

## 2018-03-06 NOTE — Patient Instructions (Signed)
Sarilumab injection What is this medicine? SARILUMAB (sar IL ue mab) is used to treat adults with rheumatoid arthritis (RA). This medicine helps reduce joint pain and swelling. This medicine is often used with other medicines. This medicine may be used for other purposes; ask your health care provider or pharmacist if you have questions. COMMON BRAND NAME(S): KEVZARA What should I tell my health care provider before I take this medicine? They need to know if you have any of these conditions: -cancer -diabetes -diverticulitis -having surgery -hepatitis B or history of hepatitis B infection -high cholesterol -immune system problems -infection (especially a virus infection such as chickenpox, cold sores, or herpes) -liver disease -low blood counts, like low white cell, platelets, or red cell counts -recently received or scheduled to receive a vaccine -tuberculosis, a positive skin test for tuberculosis or have recently been in close contact with someone who has tuberculosis -an unusual or allergic reaction to sarilumab, other medicines, foods, dyes or preservatives -pregnant or trying to get pregnant -breast-feeding How should I use this medicine? This medicine is for injection under the skin. You will be taught how to prepare and give this medicine. Use exactly as directed. Take your medicine at regular intervals. Do not take your medicine more often than directed. It is important that you put your used needles and syringes in a special sharps container. Do not put them in a trash can. If you do not have a sharps container, call your pharmacist of healthcare provider to get one. A special MedGuide will be given to you by the pharmacist with each prescription and refill. Be sure to read this information carefully each time. Talk to your pediatrician regarding the use of this medicine in children. Special care may be needed. Overdosage: If you think you have taken too much of this medicine  contact a poison control center or emergency room at once. NOTE: This medicine is only for you. Do not share this medicine with others. What if I miss a dose? If you miss a dose, take it as soon as you can. If it is almost time for your next dose, take only that dose. Do not take double or extra doses. What may interact with this medicine? Do not take this medicine with any of the following medications: -live virus vaccines This medicine may also interact with the following medications: -abatacept -adalimumab -anakinra -atorvastatin -certolizumab -etanercept -golimumab -infliximab -lovastatin -medicines that lower the immune system -ofatumumab -oral contraceptives -rituximab -steroid medicines like prednisone or cortisone -theophylline -tocilizumab -tofacitinib -warfarin This list may not describe all possible interactions. Give your health care provider a list of all the medicines, herbs, non-prescription drugs, or dietary supplements you use. Also tell them if you smoke, drink alcohol, or use illegal drugs. Some items may interact with your medicine. What should I watch for while using this medicine? Tell your doctor or healthcare professional if your symptoms do not start to get better or if they get worse. You may need blood work done while you are taking this medicine. You will be tested for tuberculosis (TB) before you start this medicine. If your doctor prescribes any medicines for TB, you should start taking the TB medicine before starting this medicine. Make sure to finish the full course of TB medicine. Call your doctor or healthcare professional for advice if you get a fever, chills or sore throat, or other symptoms of a cold or flu. Do not treat yourself. This drug decreases your body's ability to fight infections.  Try to avoid being around people who are sick. Talk to your doctor about your risk of cancer. You may be at risk for certain types of cancers if you take this  medicine. What side effects may I notice from receiving this medicine? Side effects that you should report to your doctor or health care professional as soon as possible: -allergic reactions like skin rash, itching or hives, swelling of the face, lips, or tongue -breathing problems -feeling faint or lightheaded, falls -fever or chills, sore throat -stomach pain -unusual bleeding or bruising -yellowing of the eyes or skin Side effects that usually do not require medical attention (report these to your doctor or health care professional if they continue or are bothersome): -pain, redness, or irritation at site where injected This list may not describe all possible side effects. Call your doctor for medical advice about side effects. You may report side effects to FDA at 1-800-FDA-1088. Where should I keep my medicine? Keep out of the reach of children. Store in the refrigerator at 2 to 8 degrees C (36 to 46 degrees F). Do not freeze. Store in the original carton until use and protect from light. This injection may be stored at room temperature up to 25 degrees C (77 degrees F) for up to 14 days in the original carton. Throw away any unused medicine after the expiration date on the label. NOTE: This sheet is a summary. It may not cover all possible information. If you have questions about this medicine, talk to your doctor, pharmacist, or health care provider.  2018 Elsevier/Gold Standard (2016-05-22 13:54:52)  

## 2018-03-06 NOTE — Progress Notes (Signed)
Office Visit Note  Patient: Hayley Burke             Date of Birth: 08-Jan-1986           MRN: [id number]             PCP: Wynell Balloon, PA-C Referring: Wynell Balloon, [place]* Visit Date: 03/06/2018 Occupation: @GUAROCC @    Subjective:  Pain in multiple joints    History of Present Illness: Hayley Burke is a 32 y.o. female with history of seropositive rheumatoid arthritis.  Patient states she has been taking methotrexate 0.8 ml subcutaneous, folic acid 2 mg daily and Orencia.  Her last injection of Orencia was on Sunday.  She recently finished a prednisone taper.  Patient states that she has been having increased pain in multiple joints.  She states that the pain seems to migrate from different joints in the last 2-3 days and then resolve.  She denies any joint swelling.  She states that about 2 days ago her right shoulder was causing significant discomfort.  She states that the pain has improved.  She has been taking Motrin and icing her joints when they do cause discomfort.  She would like to consider trying different medications since Orencia does not seem to be helping.  She has failed Orencia and Humira in the past.  Patient states that she has a hematology appointment on 03/20/2017 to address her anemia. She reports her trigger finger of her right middle finger has improved.   Activities of Daily Living:  Patient reports morning stiffness for 2 hours.   Patient Reports nocturnal pain.  Difficulty dressing/grooming: Denies Difficulty climbing stairs: Denies Difficulty getting out of chair: Denies Difficulty using hands for taps, buttons, cutlery, and/or writing: Reports   Review of Systems  Constitutional: Positive for fatigue. Negative for weakness.  HENT: Positive for mouth sores. Negative for trouble swallowing, trouble swallowing, mouth dryness and nose dryness.   Eyes: Negative for pain, redness, visual disturbance and dryness.  Respiratory: Negative for cough,  hemoptysis, shortness of breath and difficulty breathing.   Cardiovascular: Negative for chest pain, palpitations, hypertension, irregular heartbeat and swelling in legs/feet.  Gastrointestinal: Negative for blood in stool, constipation and diarrhea.  Endocrine: Negative for increased urination.  Genitourinary: Negative for painful urination.  Musculoskeletal: Positive for arthralgias, joint pain and morning stiffness. Negative for joint swelling, myalgias, muscle weakness, muscle tenderness and myalgias.  Skin: Negative for color change, rash, hair loss, nodules/bumps, redness, skin tightness, ulcers and sensitivity to sunlight.  Allergic/Immunologic: Negative for susceptible to infections.  Neurological: Positive for headaches. Negative for dizziness and numbness.  Hematological: Negative for swollen glands.  Psychiatric/Behavioral: Negative for depressed mood and sleep disturbance. The patient is not nervous/anxious.     PMFS History:  Patient Active Problem List   Diagnosis Date Noted  . History of anemia 05/29/2017  . 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 High risk medications (not anticoagulants) long-term use 05/03/2017  . Pain in left wrist 05/03/2017  . Trigger finger, right middle finger 05/03/2017    Past Medical History:  Diagnosis Date  . Rheumatoid arthritis (HCC)     History reviewed. No pertinent family history. Past Surgical History:  Procedure Laterality Date  . CESAREAN SECTION    . TONSILLECTOMY AND ADENOIDECTOMY     Social History   Social History Narrative  . Not on file     Objective: Vital Signs: BP (!) 119/59 (  BP Location: Left Arm, Patient Position: Sitting, Cuff Size: Normal)   Pulse 86   Resp 15   Ht 5\' 5"  (1.651 m)   Wt 144 lb 8 oz (65.5 kg)   BMI 24.05 kg/m    Physical Exam  Constitutional: She is oriented to person, place, and time. She appears  well-developed and well-nourished.  HENT:  Head: Normocephalic and atraumatic.  Eyes: Conjunctivae and EOM are normal.  Neck: Normal range of motion.  Cardiovascular: Normal rate, regular rhythm, normal heart sounds and intact distal pulses.  Pulmonary/Chest: Effort normal and breath sounds normal.  Abdominal: Soft. Bowel sounds are normal.  Lymphadenopathy:    She has no cervical adenopathy.  Neurological: She is alert and oriented to person, place, and time.  Skin: Skin is warm and dry. Capillary refill takes less than 2 seconds.  Psychiatric: She has a normal mood and affect. Her behavior is normal.  Nursing note and vitals reviewed.    Musculoskeletal Exam: C-spine, thoracic spine, lumbar spine good range of motion.  No midline spinal tenderness.  No SI joint tenderness.  Shoulder joints, elbow joints, wrist joints, MCPs, PIPs, DIPs good range of motion with no synovitis.  Hip joints, knee joints, ankle joints, MTPs, PIPs, DIPs good range of motion with no synovitis.  No warmth or effusion of bilateral knees.  Mild crepitus of bilateral knees.  No tenderness of trochanteric bursa.  CDAI Exam: CDAI Homunculus Exam:   Tenderness:  RUE: glenohumeral  Joint Counts:  CDAI Tender Joint count: 1 CDAI Swollen Joint count: 0  Global Assessments:  Patient Global Assessment: 6 Provider Global Assessment: 3  CDAI Calculated Score: 10    Investigation: No additional findings. CBC Latest Ref Rng & Units 12/06/2017 08/24/2017 03/26/2017  WBC 3.8 - 10.8 Thousand/uL 10.3 6.6 6.8  Hemoglobin 11.7 - 15.5 g/dL 11.5(L) 11.9 9.5(L)  Hematocrit 35.0 - 45.0 % 34.7(L) 36.3 31.2(L)  Platelets 140 - 400 Thousand/uL 296 295 385   CMP Latest Ref Rng & Units 12/06/2017 08/24/2017 03/26/2017  Glucose 65 - 99 mg/dL 75 74 84  BUN 7 - 25 mg/dL 13 13 16   Creatinine 0.50 - 1.10 mg/dL 05/26/2017 1.19  Sodium 135 - 146 mmol/L 140 141 142  Potassium 3.5 - 5.3 mmol/L 4.1 4.3 4.1  Chloride 98 - 110 mmol/L 106  107 108  CO2 20 - 32 mmol/L 26 27 25   Calcium 8.6 - 10.2 mg/dL 8.6 8.9 1.47)  Total Protein 6.1 - 8.1 g/dL 8.29) 6.1 6.1  Total Bilirubin 0.2 - 1.2 mg/dL 0.3 0.3 0.2  Alkaline Phos 33 - 115 U/L - - 56  AST 10 - 30 U/L 16 17 20   ALT 6 - 29 U/L 12 17 15     Imaging: No results found.  Speciality Comments: No specialty comments available.    Procedures:  No procedures performed Allergies: Sulfa antibiotics   Assessment / Plan:     Visit Diagnoses: Rheumatoid arthritis with rheumatoid factor of multiple sites without organ or systems involvement [facility]): She has no synovitis on exam.  She has been having increased pain in multiple joints that last about 2-3 days and resolves on its own.  She denies any joint swelling.  She continues to have morning stiffness for about 2 hours.  She has not missed any of her doses of methotrexate 0.8 mL subcutaneous every week, folic acid 2 mg daily and Orencia once a week.  Her last Orencia injection was once a week.  She has failed  Enbrel and Humira in the past.  We had detailed discussion regarding different treatment options and their side effects.  As she is having flares while she is on Orencia.  Indication side effects contraindications of Kevzara were discussed.  Handout was given and consent was obtained.  We will apply for the medication.  She will need a fasting lipid panel prior to starting the medication.  She will discontinue Orencia 1 week prior to [place].  Medication counseling:   Lipid panel: Pending  Chest x-ray: December 31, 2015  Counseled patient that Carlis Abbott is an IL-6 blocking agent.  Her dose will be 200 mg subcu every two  Weeks. Counseled patient on purpose, proper use, and adverse effects of Kevzara.  Reviewed the most common adverse effects including infections, injection site reaction, bowel injury, and rarely cancer.  Reviewed that the medication should be held during infections.  Discussed that there is the possibility of an  increased risk of malignancy but it is not well understood if this increased risk is due to the medication or the disease state.  Reviewed the importance of regular labs while on Kevzara therapy including the need for routine lipid panel.  Advised patient to get standing labs one month after starting Kevzara.  Provided patient with standing lab orders.  Counseled patient that Carlis Abbott should be held prior to scheduled surgery.  Counseled patient to avoid live vaccines while on Kevzara.  Advised patient to get annual influenza vaccine and the pneumococcal vaccine.  Provided patient with medication education material and answered all questions.  Patient voiced understanding.  Patient consented to [facility].  Will upload consent into the media tab.  Reviewed storage instructions of Carlis Abbott with patient.  Advised it should be kept in the refrigerator until she is ready for use.  Advised patient that initial Kevzara injection must be given in the office.  Will apply for Kevzara through patient's insurance.     High risk medications (not anticoagulants) long-term use - MTX 0.8 ml subcutaneous injections once a week, folic acid 2 mg daily, Orencia clickject.  Patient is having her recent lab report faxed to [place].  TB gold was ordered today.  Trigger finger, right middle finger: Improved.  History of anemia: She is following up with a hematologist on 03/20/2018.    Orders: Orders Placed This Encounter  Procedures  . Lipid panel  . TEST AUTHORIZATION   No orders of the defined types were placed in this encounter.   Face-to-face time spent with patient was 30 minutes. >50% of time was spent in counseling and coordination of care.  Follow-Up Instructions: Return in about 3 months (around 06/06/2018) for Rheumatoid arthritis.   Sherron Ales MD  I examined and evaluated the patient with Sherron Ales PA.  Patient had no synovitis sounds examination today.  She reports frequent flares with increased pain and  discomfort in her bilateral shoulders.  She states the flares have increased since she has been on Orencia.  She would like to change her treatment plan.  We had detailed discussion regarding different treatment options and their side effects.  She wants to start on Kevzara.  Indication side effects contraindications were discussed.  Handout was given.  An informed consent was obtained.  We will apply for Kevzara.  The plan of care was discussed as noted above.  Pollyann Savoy, MD  Note - This record has been created using Animal nutritionist.  Chart creation errors have been sought, but may not always  have been located.  Such creation errors do not reflect on  the standard of medical care.

## 2018-03-07 ENCOUNTER — Other Ambulatory Visit: Payer: Self-pay | Admitting: Rheumatology

## 2018-03-07 ENCOUNTER — Telehealth: Payer: Self-pay | Admitting: Rheumatology

## 2018-03-07 NOTE — Telephone Encounter (Signed)
Last Visit: 03/06/18 Next Visit: 06/21/18  Patient was in office yesterday. We will be starting on Kevzara, Okay to refill Prednisone taper?

## 2018-03-07 NOTE — Telephone Encounter (Signed)
Patient called for prescription refill of Prednisone.  Patient's pharmacy is [facility] on [address]

## 2018-03-07 NOTE — Telephone Encounter (Signed)
Called [employer] to check the status of Kevzara prior authorization. Spoke with Cala Bradford who states the pt has been approved from 03/06/18 through 3/14/20120.  [id number] Reference number: [id number]  Phone: [phone]  Called pt to update. We will have a new rx sent to the [facility]. The first fill will be delivered to the clinic. Once we receive the medication, we will schedule the nursing visit.   Could not leave a message. Sent a message in MyChart. Patient will need to be updated. A new Rx will need to be sent to the [facility] at [facility]. Once the medication has been received, please schedule a nursing visit. Thanks!  Jaevion Goto, [place], CPhT 3:34 PM

## 2018-03-07 NOTE — Telephone Encounter (Signed)
OK to refill Prednisone taper.

## 2018-03-08 NOTE — Telephone Encounter (Signed)
Prescription has been sent to the pharmacy. 

## 2018-03-11 ENCOUNTER — Other Ambulatory Visit: Payer: Self-pay

## 2018-03-11 DIAGNOSIS — Z79899 Other long term (current) drug therapy: Secondary | ICD-10-CM

## 2018-03-12 ENCOUNTER — Other Ambulatory Visit: Payer: Self-pay | Admitting: *Deleted

## 2018-03-12 ENCOUNTER — Other Ambulatory Visit: Payer: Self-pay

## 2018-03-12 DIAGNOSIS — Z79899 Other long term (current) drug therapy: Secondary | ICD-10-CM

## 2018-03-12 LAB — LIPID PANEL
Cholesterol: 165 mg/dL (ref <200)
HDL: 81 mg/dL (ref >50)
LDL Cholesterol (Calc): 73 mg/dL (calc)
Non-HDL Cholesterol (Calc): 84 mg/dL (calc) (ref <130)
Total CHOL/HDL Ratio: 2 (calc) (ref <5.0)
Triglycerides: 40 mg/dL (ref <150)

## 2018-03-12 NOTE — Telephone Encounter (Signed)
Spoke with patient in clinic while getting her labs drawn. Informed her of the approval for Kevzara. We will send her Rx to [facility] and have the first refill delivered to the clinic. Once received, we will schedule a nursing visit for administration. Patient voices understanding and denies any questions at this time.   Please send Rx for Carlis Abbott to [facility]. Thanks!  Amarri Satterly, [place], CPhT 11:28 AM

## 2018-03-13 LAB — QUANTIFERON-TB GOLD PLUS
Mitogen-NIL: 1.18 IU/mL
NIL: 0.01 IU/mL
QUANTIFERON-TB GOLD PLUS: NEGATIVE
TB1-NIL: 0 IU/mL
TB2-NIL: 0.01 IU/mL

## 2018-03-13 NOTE — Telephone Encounter (Signed)
Kevzara 200 mg subcu once every 2 weeks

## 2018-03-13 NOTE — Progress Notes (Signed)
WNL

## 2018-03-13 NOTE — Telephone Encounter (Signed)
Please verify dose.

## 2018-03-14 MED ORDER — SARILUMAB 200 MG/1.14ML ~~LOC~~ SOSY: 200.0000 mg | PREFILLED_SYRINGE | SUBCUTANEOUS | 0 refills | Status: DC

## 2018-03-14 MED FILL — KEVZARA 200 MG/1.14ML SOSY: 200 | 28 days supply | Qty: 2 | Fill #0

## 2018-03-14 NOTE — Telephone Encounter (Signed)
Prescription sent to the pharmacy and will schedule nurse visit once medication is received in office.

## 2018-03-14 NOTE — Addendum Note (Signed)
Addended by: Henriette Combs on: 03/14/2018 08:49 AM   Modules accepted: Orders

## 2018-03-15 ENCOUNTER — Telehealth: Payer: Self-pay

## 2018-03-15 NOTE — Telephone Encounter (Signed)
Coordinated delivery of Kevzara from [employer]. Medication has been placed in the refrigerator. Please schedule nursing visit. Thanks!  Iesha Summerhill, [place], CPhT 3:45 PM

## 2018-03-18 NOTE — Telephone Encounter (Signed)
Patient has been scheduled for 03/20/18 at 2:00pm.

## 2018-03-18 NOTE — Progress Notes (Addendum)
HEMATOLOGY/ONCOLOGY CONSULTATION NOTE  Date of Service: 03/20/18  Patient Care Team: Sapp, Merceda Elks, PA-C as PCP - General (Physician Assistant)  CHIEF COMPLAINTS/PURPOSE OF CONSULTATION:  Anemia  HISTORY OF PRESENTING ILLNESS:   Hayley Burke is a wonderful 32 y.o. female who has been referred to [place] by Oleta Mouse N.P for evaluation and management of anemia. She is accompanied today by her mother and daughter. The pt reports that she is doing well overall.   The pt reports that she sees Dr. Corliss Skains for her RA. She notes that she has taken Metholtrexate 0.8 mL for 6 years.  She was first diagnosed with RA 6 years ago, she has also tried Plaquinil,  Humara, and Enbrel in the past. She notes that she has been on biologics since being diagnosed. She takes 2mg  Folic acid replacement daily. She notes that she receives prednisone during her RA flare ups.   She notes that she first learned of her anemia a year ago. She noticed that her nails were getting more brittle, and that she was more tired.  She notes that she took Ferrous sulfate in the past, during her pregnancy 4 years ago.  She notes that she has ice cravings. She notes that she has not had a period since beginning Sarilumab in January. She notes that she has had normal to slightly heavy before this. She has never had IV iron nor a blood transfusion.   She notes that she takes 2-4 tablets of Ibuprofen daily for 6-7 years for chronic headaches and migraines. She denies any stomach issues and does not take OTC anti acids. She notes that she has no drug allergies besides Sulfa antibiotics which was accompanied by hives.   Most recent lab results (02/22/18) of CBC  is as follows: all values are WNL except for Hgb at 11.0, RDW at 17.6, EOS Abs aty 1.1k. Ferritin 02/22/18 was low at 13. Vitamin B12 02/22/18 was slightly elevated at 1318.  Iron and TIBC 02/22/18 showed Iron low at 15 and Iron Sat low at 5%  On review of systems, pt  reports being tired, occasional mouth sores, and denies blood in the stools, melena, any bleeding, sudden weight loss, fevers, chills, night sweats, noticing any enlarged lymph nodes, abdominal pains, leg swelling and any other symptoms.   On PMHx the pt reports Rheumatoid Arthritis, and anemia. On Surgical Hx the pt reports adenoid surgery, caesarean section with a post-operative hematoma, and tonsilectomy.  On Social Hx the pt denies much ETOH use and quit smoking 5 years ago.  On Family Hx the pt reports her father had lung cancer, her cousins had prostate cancer.   MEDICAL HISTORY:  Past Medical History:  Diagnosis Date  . Rheumatoid arthritis (HCC)     SURGICAL HISTORY: Past Surgical History:  Procedure Laterality Date  . CESAREAN SECTION    . TONSILLECTOMY AND ADENOIDECTOMY      SOCIAL HISTORY: Social History   Socioeconomic History  . Marital status: Unknown    Spouse name: Not on file  . Number of children: Not on file  . Years of education: Not on file  . Highest education level: Not on file  Occupational History  . Not on file  Social Needs  . Financial resource strain: Not on file  . Food insecurity:    Worry: Not on file    Inability: Not on file  . Transportation needs:    Medical: Not on file    Non-medical: Not on file  Tobacco Use  . Smoking status: Former Games developer  . Smokeless tobacco: Never Used  Substance and Sexual Activity  . Alcohol use: No  . Drug use: No  . Sexual activity: Yes  Lifestyle  . Physical activity:    Days per week: Not on file    Minutes per session: Not on file  . Stress: Not on file  Relationships  . Social connections:    Talks on phone: Not on file    Gets together: Not on file    Attends religious service: Not on file    Active member of club or organization: Not on file    Attends meetings of clubs or organizations: Not on file    Relationship status: Not on file  . Intimate partner violence:    Fear of current or ex  partner: Not on file    Emotionally abused: Not on file    Physically abused: Not on file    Forced sexual activity: Not on file  Other Topics Concern  . Not on file  Social History Narrative  . Not on file    FAMILY HISTORY: No family history on file.  ALLERGIES:  is allergic to sulfa antibiotics.  MEDICATIONS:  Current Outpatient Medications  Medication Sig Dispense Refill  . aspirin-acetaminophen-caffeine (EXCEDRIN MIGRAINE) 250-250-65 MG tablet Take 1 tablet by mouth every 6 (six) hours as needed for headache.    . Cyanocobalamin (VITAMIN B-12 PO) Take by mouth daily.    . Ferrous Sulfate (IRON) 325 (65 Fe) MG TABS Take 1 tablet by mouth daily.    . folic acid (FOLVITE) 1 MG tablet Take 2 tablets (2 mg total) by mouth daily. 60 tablet 3  . Levonorgestrel (KYLEENA) 19.5 MG IUD Kyleena 17.5 mcg/24 hour (5 years) intrauterine device  TO BE INSERTED ONE TIME BY PRESCRIBER. ROUTE INTRAUTERINE.    . methotrexate 250 MG/10ML injection Inject 0.8 mLs (20 mg total) into the skin once a week. 10 mL 0  . naproxen (NAPROSYN) 500 MG tablet Naprosyn 500 mg tablet  Take 1 tablet twice a day by oral route for 30 days.  take with food. as needed    . prednisoLONE acetate (PRED FORTE) 1 % ophthalmic suspension 1 drop TWICE DAILY IN EACH EYE (shakewell) (100/4=25) AS NEEDED  3  . predniSONE (DELTASONE) 5 MG tablet Take 4 tabs po x 2 days. 3 tabs po x 2 days, 2 tabs po x 2 days, 1 tab po x 2 days 20 tablet 0  . pyridOXINE (VITAMIN B-6) 100 MG tablet Take 100 mg by mouth daily.    . RESTASIS MULTIDOSE 0.05 % ophthalmic emulsion 1 drop TWICE DAILY IN [facility] EYE (110/4=25) AS NEEDED  3  . Sarilumab (KEVZARA) 200 MG/1. SOSY Inject 200 mg into the skin every 14 (fourteen) days. 6 Syringe 0   No current facility-administered medications for this visit.     REVIEW OF SYSTEMS:    10 Point review of Systems was done is negative except as noted above.  PHYSICAL EXAMINATION:  . Vitals:   03/20/18  1009  BP: 129/83  Pulse: 96  Resp: 18  Temp: 98.5 F (36.9 C)  SpO2: 100%   Filed Weights   03/20/18 1009  Weight: 146 lb 14.4 oz (66.6 kg)   .Body mass index is 24.45 kg/m.  GENERAL:alert, in no acute distress and comfortable SKIN: no acute rashes, no significant lesions EYES: conjunctiva are pink and non-injected, sclera anicteric OROPHARYNX: MMM, no exudates, no oropharyngeal erythema  or ulceration NECK: supple, no JVD LYMPH:  no palpable lymphadenopathy in the cervical, axillary or inguinal regions LUNGS: clear to auscultation b/l with normal respiratory effort HEART: regular rate & rhythm ABDOMEN:  normoactive bowel sounds , non tender, not distended. Extremity: no pedal edema PSYCH: alert & oriented x 3 with fluent speech NEURO: no focal motor/sensory deficits  LABORATORY DATA:  I have reviewed the data as listed  . CBC Latest Ref Rng & Units 03/20/2018 12/06/2017 08/24/2017  WBC 3.9 - 10.3 K/uL 9.5 10.3 6.6  Hemoglobin 11.7 - 15.5 g/dL - 11.5(L) 11.9  Hematocrit 34.8 - 46.6 % 33.5(L) 34.7(L) 36.3  Platelets 145 - 400 K/uL 404(H) 296 295  HGB 10.6 ANC 6.1k  . CMP Latest Ref Rng & Units 03/20/2018 12/06/2017 08/24/2017  Glucose 70 - 140 mg/dL 84 75 74  BUN 7 - 26 mg/dL 12 13 13   Creatinine 0.60 - 1.10 mg/dL 1.49 7.02  Sodium 136 - 145 mmol/L 139 140 141  Potassium 3.5 - 5.1 mmol/L 4.1 4.1 4.3  Chloride 98 - 109 mmol/L 108 106 107  CO2 22 - 29 mmol/L 25 26 27   Calcium 8.4 - 10.4 mg/dL 8.5 8.6 8.9  Total Protein 6.4 - 8.3 g/dL 6.37) ) 6.1  Total Bilirubin 0.2 - 1.2 mg/dL 0.3 0.3 0.3  Alkaline Phos 40 - 150 U/L 54 - -  AST 5 - 34 U/L 26 16 17   ALT 0 - 55 U/L 21 12 17    Component     Latest Ref Rng & Units 03/20/2018  Iron     41 - 142 ug/dL 55  TIBC     8.5(O - ug/dL  Saturation Ratios     21 - 57 % 15 (L)  UIBC     ug/dL 05/20/2018  Sed Rate     0 - 22 mm/hr 5  Vitamin D, 25-Hydroxy     30.0 - 100.0 ng/mL 26.0 (L)  Intrinsic Factor     0.0 -  1.1 AU/mL 1.0  Parietal Cell Antibody-IgG     0.0 - 20.0 Units 28.0 (H)  Vitamin B12     180 - 914 pg/mL 1,021 (H)  Ferritin     9 - 269 ng/mL 10    RADIOGRAPHIC STUDIES: I have personally reviewed the radiological images as listed and agreed with the findings in the report. No results found.  ASSESSMENT & PLAN:  32 y.o. female with  1. Iron Deficiency Anemia -Discussed patient's most recent labs, her Hgb was around 14 as of 2 years ago, but had dropped to 9.5 in 09/2016. Ferritin was at 13 on 02/22/18.  -Discussed IV and PO iron replacement therapies.  -The pt elected to have IV iron.  -IV Injectafer x 1 dose.  -Goal of treatment would be to keep Ferritin around 100 especially given chronic inflammation from RA -she will also start PO Iron Polysaccharide 150mg  p daily for maintenance. -We will let PCP determine if an endoscopy is necessary given the patient's significant NSAID use with anemia and concern for ulcer formation.  -Labs today -Recommend also starting PO Iron Polysaccharide with food to evaluate if she tolerates it well, in which case we would advise PO Iron for maintenance purposes.  2) h/o B12 deficiency . 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 Could be a cause of the Iron and B12 deficiency B12 currentl WNL @ 1021  Labs today IV injectafer 750mg  x 1 dose RTC with Dr [id number]  in 2 months with labs   All of the patients questions were answered with apparent satisfaction. The patient knows to call the clinic with any problems, questions or concerns.  I spent 35 minutes counseling the patient face to face. The total time spent in the appointment was 45 minutes and more than 50% was on counseling and direct patient cares.    Wyvonnia Lora MD MS AAHIVMS [facility] Hematology/Oncology Physician [facility]  (Office):       [phone] (Work cell):  [phone] (Fax):           [phone]  03/20/2018 10:42 AM  This document serves as a  record of services personally performed by Wyvonnia Lora, MD. It was created on his behalf by Marcelline Mates, a trained medical scribe. The creation of this record is based on the scribe's personal observations and the provider's statements to them.   .I have reviewed the above documentation for accuracy and completeness, and I agree with the above. Johney Maine MD MS

## 2018-03-20 ENCOUNTER — Inpatient Hospital Stay: Payer: Medicaid Other

## 2018-03-20 ENCOUNTER — Inpatient Hospital Stay: Payer: Medicaid Other | Attending: Hematology | Admitting: Hematology

## 2018-03-20 ENCOUNTER — Ambulatory Visit (INDEPENDENT_AMBULATORY_CARE_PROVIDER_SITE_OTHER): Payer: Medicaid Other | Admitting: *Deleted

## 2018-03-20 VITALS — BP 94/68 | HR 84

## 2018-03-20 VITALS — BP 129/83 | HR 96 | Temp 98.5°F | Resp 18 | Ht 65.0 in | Wt 146.9 lb

## 2018-03-20 DIAGNOSIS — Z801 Family history of malignant neoplasm of trachea, bronchus and lung: Secondary | ICD-10-CM | POA: Diagnosis not present

## 2018-03-20 DIAGNOSIS — D509 Iron deficiency anemia, unspecified: Secondary | ICD-10-CM

## 2018-03-20 DIAGNOSIS — E559 Vitamin D deficiency, unspecified: Secondary | ICD-10-CM

## 2018-03-20 DIAGNOSIS — M069 Rheumatoid arthritis, unspecified: Secondary | ICD-10-CM | POA: Diagnosis not present

## 2018-03-20 DIAGNOSIS — M0579 Rheumatoid arthritis with rheumatoid factor of multiple sites without organ or systems involvement: Secondary | ICD-10-CM

## 2018-03-20 DIAGNOSIS — Z87891 Personal history of nicotine dependence: Secondary | ICD-10-CM | POA: Diagnosis not present

## 2018-03-20 DIAGNOSIS — E538 Deficiency of other specified B group vitamins: Secondary | ICD-10-CM

## 2018-03-20 DIAGNOSIS — D510 Vitamin B12 deficiency anemia due to intrinsic factor deficiency: Secondary | ICD-10-CM | POA: Insufficient documentation

## 2018-03-20 LAB — CBC WITH DIFFERENTIAL (CANCER CENTER ONLY)
Basophils Absolute: 0 10*3/uL (ref 0.0–0.1)
Basophils Relative: 0 %
Eosinophils Absolute: 1.4 10*3/uL — ABNORMAL HIGH (ref 0.0–0.5)
Eosinophils Relative: 14 %
HEMATOCRIT: 33.5 % — AB (ref 34.8–46.6)
HEMOGLOBIN: 10.6 g/dL — AB (ref 11.6–15.9)
LYMPHS ABS: 1.6 10*3/uL (ref 0.9–3.3)
LYMPHS PCT: 17 %
MCH: 28.2 pg (ref 25.1–34.0)
MCHC: 31.6 g/dL (ref 31.5–36.0)
MCV: 89.1 fL (ref 79.5–101.0)
MONOS PCT: 5 %
Monocytes Absolute: 0.5 10*3/uL (ref 0.1–0.9)
NEUTROS ABS: 6.1 10*3/uL (ref 1.5–6.5)
NEUTROS PCT: 64 %
Platelet Count: 404 10*3/uL — ABNORMAL HIGH (ref 145–400)
RBC: 3.76 MIL/uL (ref 3.70–5.45)
RDW: 16.8 % — ABNORMAL HIGH (ref 11.2–14.5)
WBC: 9.5 10*3/uL (ref 3.9–10.3)

## 2018-03-20 LAB — CMP (CANCER CENTER ONLY)
ALT: 21 U/L (ref 0–55)
ANION GAP: 6 (ref 3–11)
AST: 26 U/L (ref 5–34)
Albumin: 3.3 g/dL — ABNORMAL LOW (ref 3.5–5.0)
Alkaline Phosphatase: 54 U/L (ref 40–150)
BUN: 12 mg/dL (ref 7–26)
CHLORIDE: 108 mmol/L (ref 98–109)
CO2: 25 mmol/L (ref 22–29)
CREATININE: 0.7 mg/dL (ref 0.60–1.10)
Calcium: 8.5 mg/dL (ref 8.4–10.4)
Glucose, Bld: 84 mg/dL (ref 70–140)
POTASSIUM: 4.1 mmol/L (ref 3.5–5.1)
Sodium: 139 mmol/L (ref 136–145)
Total Bilirubin: 0.3 mg/dL (ref 0.2–1.2)
Total Protein: 5.8 g/dL — ABNORMAL LOW (ref 6.4–8.3)

## 2018-03-20 LAB — IRON AND TIBC
Iron: 55 ug/dL (ref 41–142)
Saturation Ratios: 15 % — ABNORMAL LOW (ref 21–57)
TIBC: 361 ug/dL (ref 236–444)
UIBC: 307 ug/dL

## 2018-03-20 LAB — RETICULOCYTES
RBC.: 3.76 MIL/uL (ref 3.70–5.45)
RETIC CT PCT: 1.1 % (ref 0.7–2.1)
Retic Count, Absolute: 41.4 10*3/uL (ref 33.7–90.7)

## 2018-03-20 LAB — VITAMIN B12: VITAMIN B 12: 1021 pg/mL — AB (ref 180–914)

## 2018-03-20 LAB — FERRITIN: Ferritin: 10 ng/mL (ref 9–269)

## 2018-03-20 LAB — SEDIMENTATION RATE: Sed Rate: 5 mm/hr (ref 0–22)

## 2018-03-20 MED ORDER — POLYSACCHARIDE IRON COMPLEX 150 MG PO CAPS: 150.0000 mg | ORAL_CAPSULE | Freq: Every day | ORAL | 3 refills | Status: DC

## 2018-03-20 MED ORDER — SARILUMAB 200 MG/1.14ML ~~LOC~~ SOSY
200.0000 mg | PREFILLED_SYRINGE | Freq: Once | SUBCUTANEOUS | Status: AC
Start: 2018-03-20 — End: 2018-03-20
  Administered 2018-03-20: 200 mg via SUBCUTANEOUS

## 2018-03-20 NOTE — Patient Instructions (Signed)
Standing Labs We placed an order today for your standing lab work.    Please come back and get your standing labs in 1 month and every 3 months  We have open lab Monday through Friday from 8:30-11:30 AM and 1:30-4:00 PM  at the office of Dr. Shaili Deveshwar.   You may experience shorter wait times on Monday and Friday afternoons. The office is located at [address], [address], [place], [place] [address] No appointment is necessary.   Labs are drawn by [facility].  You may receive a bill from [facility] for your lab work. If you have any questions regarding directions or hours of operation,  please call [phone].     

## 2018-03-20 NOTE — Progress Notes (Signed)
Patient in office for new start to [facility]. Patient was given demonstration on proper technique to self administer medication. Patient was given medication in her right lower abdomen and tolerated injection well. Patient was monitored in office for 30 minutes after administration for adverse reactions. No adverse reactions noted.   Administrations This Visit    Sarilumab SOSY 200 mg    Admin Date 03/20/2018 Action Given Dose 200 mg Route Subcutaneous Administered By Henriette Combs, LPN

## 2018-03-21 LAB — VITAMIN D 25 HYDROXY (VIT D DEFICIENCY, FRACTURES): VIT D 25 HYDROXY: 26 ng/mL — AB (ref 30.0–100.0)

## 2018-03-21 LAB — INTRINSIC FACTOR ANTIBODIES: INTRINSIC FACTOR: 1 [AU]/ml (ref 0.0–1.1)

## 2018-03-21 LAB — ANTI-PARIETAL ANTIBODY: PARIETAL CELL ANTIBODY-IGG: 28 U — AB (ref 0.0–20.0)

## 2018-03-25 ENCOUNTER — Ambulatory Visit: Payer: Medicaid Other

## 2018-03-25 ENCOUNTER — Telehealth: Payer: Self-pay
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
## 2018-04-01 ENCOUNTER — Other Ambulatory Visit: Payer: Self-pay | Admitting: Rheumatology

## 2018-04-01 NOTE — Telephone Encounter (Signed)
Last Visit: 03/06/18 Next Visit: 06/21/18 Labs: 03/20/18 stable  Okay to refill per Dr. Corliss Skains

## 2018-04-02 ENCOUNTER — Inpatient Hospital Stay: Payer: Medicaid Other

## 2018-04-02 DIAGNOSIS — D509 Iron deficiency anemia, unspecified: Secondary | ICD-10-CM

## 2018-04-02 MED ORDER — SODIUM CHLORIDE 0.9 % IV SOLN
750.0000 mg | Freq: Once | INTRAVENOUS | Status: AC
  Administered 2018-04-02: 750 mg via INTRAVENOUS
  Filled 2018-04-02: qty 15

## 2018-04-02 NOTE — Patient Instructions (Signed)
Ferric carboxymaltose injection What is this medicine? FERRIC CARBOXYMALTOSE (ferr-ik car-box-ee-mol-toes) is an iron complex. Iron is used to make healthy red blood cells, which carry oxygen and nutrients throughout the body. This medicine is used to treat anemia in people with chronic kidney disease or people who cannot take iron by mouth. This medicine may be used for other purposes; ask your health care provider or pharmacist if you have questions. COMMON BRAND NAME(S): Injectafer What should I tell my health care provider before I take this medicine? They need to know if you have any of these conditions: -anemia not caused by low iron levels -high levels of iron in the blood -liver disease -an unusual or allergic reaction to iron, other medicines, foods, dyes, or preservatives -pregnant or trying to get pregnant -breast-feeding How should I use this medicine? This medicine is for infusion into a vein. It is given by a health care professional in a hospital or clinic setting. Talk to your pediatrician regarding the use of this medicine in children. Special care may be needed. Overdosage: If you think you have taken too much of this medicine contact a poison control center or emergency room at once. NOTE: This medicine is only for you. Do not share this medicine with others. What if I miss a dose? It is important not to miss your dose. Call your doctor or health care professional if you are unable to keep an appointment. What may interact with this medicine? Do not take this medicine with any of the following medications: -deferoxamine -dimercaprol -other iron products This medicine may also interact with the following medications: -chloramphenicol -deferasirox This list may not describe all possible interactions. Give your health care provider a list of all the medicines, herbs, non-prescription drugs, or dietary supplements you use. Also tell them if you smoke, drink alcohol, or use  illegal drugs. Some items may interact with your medicine. What should I watch for while using this medicine? Visit your doctor or health care professional regularly. Tell your doctor if your symptoms do not start to get better or if they get worse. You may need blood work done while you are taking this medicine. You may need to follow a special diet. Talk to your doctor. Foods that contain iron include: whole grains/cereals, dried fruits, beans, or peas, leafy green vegetables, and organ meats (liver, kidney). What side effects may I notice from receiving this medicine? Side effects that you should report to your doctor or health care professional as soon as possible: -allergic reactions like skin rash, itching or hives, swelling of the face, lips, or tongue -breathing problems -changes in blood pressure -feeling faint or lightheaded, falls -flushing, sweating, or hot feelings Side effects that usually do not require medical attention (report to your doctor or health care professional if they continue or are bothersome): -changes in taste -constipation -dizziness -headache -nausea -pain, redness, or irritation at site where injected -vomiting This list may not describe all possible side effects. Call your doctor for medical advice about side effects. You may report side effects to FDA at 1-800-FDA-1088. Where should I keep my medicine? This drug is given in a hospital or clinic and will not be stored at home. NOTE: This sheet is a summary. It may not cover all possible information. If you have questions about this medicine, talk to your doctor, pharmacist, or health care provider.  2018 Elsevier/Gold Standard (2016-01-06 11:20:47)  

## 2018-04-09 ENCOUNTER — Telehealth: Payer: Self-pay | Admitting: Rheumatology

## 2018-04-09 NOTE — Telephone Encounter (Signed)
Patient states [facility] on [address] has closed. Patient will need to have rx's sent to [facility] on [address] Patient is okay with medication right now., but would like to have change made on her chart as to new pharmacy.

## 2018-04-09 NOTE — Telephone Encounter (Signed)
Patients chart updated.

## 2018-04-17 MED FILL — KEVZARA 200 MG/1.14ML SOSY: 200 | 28 days supply | Qty: 2 | Fill #1

## 2018-04-18 ENCOUNTER — Other Ambulatory Visit: Payer: Self-pay

## 2018-05-10 MED FILL — KEVZARA 200 MG/1.14ML SOSY: 200 | 28 days supply | Qty: 2 | Fill #2

## 2018-05-12 ENCOUNTER — Ambulatory Visit (HOSPITAL_COMMUNITY)
Admission: EM | Admit: 2018-05-12 | Discharge: 2018-05-12 | Disposition: A | Discharge status: Home / Self Care | Payer: Medicaid Other | Attending: Physician Assistant | Admitting: Physician Assistant

## 2018-05-12 ENCOUNTER — Other Ambulatory Visit: Payer: Self-pay

## 2018-05-12 ENCOUNTER — Encounter (HOSPITAL_COMMUNITY): Payer: Self-pay | Admitting: Emergency Medicine

## 2018-05-12 DIAGNOSIS — W25XXXA Contact with sharp glass, initial encounter: Secondary | ICD-10-CM

## 2018-05-12 DIAGNOSIS — Z23 Encounter for immunization: Secondary | ICD-10-CM | POA: Diagnosis not present

## 2018-05-12 DIAGNOSIS — S91331A Puncture wound without foreign body, right foot, initial encounter: Secondary | ICD-10-CM | POA: Diagnosis not present

## 2018-05-12 MED ORDER — CEPHALEXIN 500 MG PO CAPS: 500.0000 mg | ORAL_CAPSULE | Freq: Three times a day (TID) | ORAL | 0 refills | Status: AC

## 2018-05-12 MED ORDER — TETANUS-DIPHTH-ACELL PERTUSSIS 5-2.5-18.5 LF-MCG/0.5 IM SUSP
INTRAMUSCULAR | Status: AC
  Filled 2018-05-12: qty 0.5

## 2018-05-12 MED ORDER — TETANUS-DIPHTH-ACELL PERTUSSIS 5-2.5-18.5 LF-MCG/0.5 IM SUSP
0.5000 mL | Freq: Once | INTRAMUSCULAR | Status: AC
  Administered 2018-05-12: 0.5 mL via INTRAMUSCULAR

## 2018-05-12 NOTE — ED Triage Notes (Signed)
Stepped on glass, injury to right foot Patient removed glass.  Bleeding was not stopping.  Patient unsure about last tetanus.

## 2018-05-12 NOTE — ED Provider Notes (Signed)
05/12/2018 4:59 PM   DOB: April 15, 1986 / MRN: [id number]  SUBJECTIVE:  Hayley Burke is a 32 y.o. female presenting for glass in foot.  Tells me the glass was clean when she dropped it and stepped on a shard.  Pulled out a 1 cm long shard of glass.  Mother is a nurse who told her to come here.  This occurred today.  TD out of date per patient.   There is no immunization history for the selected administration types on file for this patient.   She is allergic to sulfa antibiotics.   She  has a past medical history of Rheumatoid arthritis (HCC).    She  reports that she has quit smoking. She has never used smokeless tobacco. She reports that she does not drink alcohol or use drugs. She  reports that she currently engages in sexual activity. The patient  has a past surgical history that includes Cesarean section and Tonsillectomy and adenoidectomy.  Her family history is not on file.  Review of Systems  Musculoskeletal: Negative for myalgias.  Endo/Heme/Allergies: Does not bruise/bleed easily.    OBJECTIVE:  BP 113/69 (BP Location: Left Arm)   Pulse 79   Temp 98.5 F (36.9 C) (Oral)   Resp 18   SpO2 100%   Wt Readings from Last 3 Encounters:  03/20/18 146 lb 14.4 oz (66.6 kg)  03/06/18 144 lb 8 oz (65.5 kg)  01/22/18 138 lb (62.6 kg)   Temp Readings from Last 3 Encounters:  05/12/18 98.5 F (36.9 C) (Oral)  03/20/18 98.5 F (36.9 C) (Oral)  03/11/17 97.6 F (36.4 C) (Oral)   BP Readings from Last 3 Encounters:  05/12/18 113/69  03/20/18 94/68  03/20/18 129/83   Pulse Readings from Last 3 Encounters:  05/12/18 79  03/20/18 84  03/20/18 96    Physical Exam  Constitutional: She is oriented to person, place, and time. She appears well-nourished. No distress.  Eyes: Pupils are equal, round, and reactive to light. EOM are normal.  Cardiovascular: Normal rate.  Pulmonary/Chest: Effort normal.  Abdominal: She exhibits no distension.  Musculoskeletal:        Feet:  Neurological: She is alert and oriented to person, place, and time. No cranial nerve deficit. Gait normal.  Skin: Skin is dry. She is not diaphoretic.  Psychiatric: She has a normal mood and affect.  Vitals reviewed.  Risk and benefits discussed and verbal consent obtained. Anesthetic allergies reviewed. Patient anesthetized using 1:1 mix of 2% lidocaine with epi and Marcaine. The wound was cleansed thoroughly with soap and water. Sterile prep and drape. Wound closed with 2 throws using 4-0 Ethilon suture material in a vertical matress fasion. Hemostasis achieved. Mupirocin applied to the wound and bandage placed. The patient tolerated well. Wound instructions were provided and the patient is to return in 10 days for suture removal.   No results found for this or any previous visit (from the past 72 hour(s)).  No results found.  ASSESSMENT AND PLAN:   Puncture wound of right foot, initial encounter: Repaired. TDAP. Keflex. RTC precautions discussed.     Discharge Instructions     Come back in ten days for suture removal.  Come back sooner if worsening pain, pus, or redness coming from the wound. Take the keflex and try not to miss doses. Take 600 to 800 mg of ibuprofen every 8 for pain.         The patient is advised to call or return to clinic if  she does not see an improvement in symptoms, or to seek the care of the closest emergency department if she worsens with the above plan.   Deliah Boston, MHS, PA-C 05/12/2018 4:59 PM   Ofilia Neas, PA-C 05/12/18 1700

## 2018-05-12 NOTE — Discharge Instructions (Addendum)
Come back in ten days for suture removal.  Come back sooner if worsening pain, pus, or redness coming from the wound. Take the keflex and try not to miss doses. Take 600 to 800 mg of ibuprofen every 8 for pain.

## 2018-05-20 ENCOUNTER — Inpatient Hospital Stay: Payer: Medicaid Other

## 2018-05-20 ENCOUNTER — Telehealth: Payer: Self-pay | Admitting: Rheumatology

## 2018-05-20 ENCOUNTER — Inpatient Hospital Stay: Payer: Medicaid Other | Attending: Hematology | Admitting: Hematology

## 2018-05-20 DIAGNOSIS — D509 Iron deficiency anemia, unspecified: Secondary | ICD-10-CM | POA: Insufficient documentation

## 2018-05-20 DIAGNOSIS — D510 Vitamin B12 deficiency anemia due to intrinsic factor deficiency: Secondary | ICD-10-CM | POA: Insufficient documentation

## 2018-05-20 DIAGNOSIS — M069 Rheumatoid arthritis, unspecified: Secondary | ICD-10-CM | POA: Insufficient documentation

## 2018-05-20 DIAGNOSIS — Z87891 Personal history of nicotine dependence: Secondary | ICD-10-CM | POA: Insufficient documentation

## 2018-05-20 MED ORDER — "TUBERCULIN SYRINGE 27G X 1/2"" 1 ML MISC": 3 refills | Status: DC

## 2018-05-20 NOTE — Telephone Encounter (Signed)
Prescription for syringes has been sent to the pharmacy. Patient advised.

## 2018-05-20 NOTE — Telephone Encounter (Signed)
Patient needs rx for syringes sent to [facility] on [address] [place]. for her MTX injections. Patient is there now. Patient wants to know if syringes are TB syringe. If it is pharmacy does not need rx.  Fax# [phone]

## 2018-05-22 ENCOUNTER — Telehealth: Payer: Self-pay | Admitting: Rheumatology

## 2018-05-22 NOTE — Telephone Encounter (Signed)
I called to check on patient.  She states she did some extra activities with her daughter and she was having a lot of pain and discomfort last night and some swelling.  By now the swelling has resolved and she is doing much better.  I will hold off prednisone.  She believes that Carlis Abbott has been working .

## 2018-05-22 NOTE — Telephone Encounter (Signed)
Patient having a flare with rt hand swelling/ pain. Patient having trouble bending fingers making a fist. Patient request rx for Prednisone to be called into [facility] if possible.

## 2018-05-22 NOTE — Telephone Encounter (Signed)
Patient states she just spoke with Dr. Corliss Skains and has decided that she would like her to send in a prescription for Prednisone to help with her pain and tenderness in her wrist and right middle finger.  Patient is requesting that it be sent to [facility] at [facility]

## 2018-05-23 NOTE — Telephone Encounter (Signed)
Patient spoke with Dr. Corliss Skains last night and prescription for Prednisone was called to pharmacy stating a 20 mg and decreasing by 5 mg every 4 days.

## 2018-06-03 ENCOUNTER — Other Ambulatory Visit: Payer: Self-pay | Admitting: Rheumatology

## 2018-06-03 NOTE — Telephone Encounter (Signed)
Last Visit: 03/06/18 Next Visit: 06/21/18 Labs: 03/20/18 stable TB Gold: 03/11/18 Neg   Okay to refill per Dr. Corliss Skains

## 2018-06-07 NOTE — Progress Notes (Deleted)
Office Visit Note  Patient: Hayley Burke             Date of Birth: 07/09/1986           MRN: [id number]             PCP: Wynell Balloon, PA-C Referring: Wynell Balloon, PA* Visit Date: 06/21/2018 Occupation: @GUAROCC @    Subjective:  No chief complaint on file.   History of Present Illness: Hayley Burke is a 32 y.o. female ***   Activities of Daily Living:  Patient reports morning stiffness for *** {minute/hour:19697}.   Patient {ACTIONS;DENIES/REPORTS:21021675::"Denies"} nocturnal pain.  Difficulty dressing/grooming: {ACTIONS;DENIES/REPORTS:21021675::"Denies"} Difficulty climbing stairs: {ACTIONS;DENIES/REPORTS:21021675::"Denies"} Difficulty getting out of chair: {ACTIONS;DENIES/REPORTS:21021675::"Denies"} Difficulty using hands for taps, buttons, cutlery, and/or writing: {ACTIONS;DENIES/REPORTS:21021675::"Denies"}   No Rheumatology ROS completed.   PMFS History:  Patient Active Problem List   Diagnosis Date Noted  . Iron deficiency anemia 03/20/2018  . History of anemia 05/29/2017  . 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 High risk medications (not anticoagulants) long-term use 05/03/2017  . Pain in left wrist 05/03/2017  . Trigger finger, right middle finger 05/03/2017    Past Medical History:  Diagnosis Date  . Rheumatoid arthritis (HCC)     No family history on file. Past Surgical History:  Procedure Laterality Date  . CESAREAN SECTION    . TONSILLECTOMY AND ADENOIDECTOMY     Social History   Social History Narrative  . Not on file     Objective: Vital Signs: There were no vitals taken for this visit.   Physical Exam   Musculoskeletal Exam: ***  CDAI Exam: No CDAI exam completed.    Investigation: No additional findings. CBC Latest Ref Rng & Units 03/20/2018 12/06/2017 08/24/2017  WBC 3.9 - 10.3 K/uL 9.5 10.3 6.6  Hemoglobin 11.6 - 15.9 g/dL 10.6(L) 11.5(L)  11.9  Hematocrit 34.8 - 46.6 % 33.5(L) 34.7(L) 36.3  Platelets 145 - 400 K/uL 404(H) 296 295   CMP Latest Ref Rng & Units 03/20/2018 12/06/2017 08/24/2017  Glucose 70 - 140 mg/dL 84 75 74  BUN 7 - 26 mg/dL 12 13 13   Creatinine 0.60 - 1.10 mg/dL 10/24/2017 2.95  Sodium 136 - 145 mmol/L 139 140 141  Potassium 3.5 - 5.1 mmol/L 4.1 4.1 4.3  Chloride 98 - 109 mmol/L 108 106 107  CO2 22 - 29 mmol/L 25 26 27   Calcium 8.4 - 10.4 mg/dL 8.5 8.6 8.9  Total Protein 6.4 - 8.3 g/dL 2.84) 1.32) 6.1  Total Bilirubin 0.2 - 1.2 mg/dL 0.3 0.3 0.3  Alkaline Phos 40 - 150 U/L 54 - -  AST 5 - 34 U/L 26 16 17   ALT 0 - 55 U/L 21 12 17      Imaging: No results found.  Speciality Comments: No specialty comments available.    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  No orders of the defined types were placed in this encounter.   Face-to-face time spent with patient was *** minutes. 50% of time was spent in counseling  and coordination of care.  Follow-Up Instructions: No follow-ups on file.   Ofilia Neas, PA-C  Note - This record has been created using Dragon software.  Chart creation errors have been sought, but may not always  have been located. Such creation errors do not reflect on  the standard of medical care.

## 2018-06-10 ENCOUNTER — Inpatient Hospital Stay: Payer: Medicaid Other

## 2018-06-10 ENCOUNTER — Inpatient Hospital Stay (HOSPITAL_BASED_OUTPATIENT_CLINIC_OR_DEPARTMENT_OTHER): Payer: Medicaid Other | Admitting: Hematology

## 2018-06-10 ENCOUNTER — Telehealth: Payer: Self-pay | Admitting: Hematology

## 2018-06-10 VITALS — BP 118/72 | HR 77 | Temp 98.3°F | Resp 18 | Ht 65.0 in | Wt 146.3 lb

## 2018-06-10 DIAGNOSIS — D510 Vitamin B12 deficiency anemia due to intrinsic factor deficiency: Secondary | ICD-10-CM | POA: Diagnosis not present

## 2018-06-10 DIAGNOSIS — M069 Rheumatoid arthritis, unspecified: Secondary | ICD-10-CM | POA: Diagnosis not present

## 2018-06-10 DIAGNOSIS — Z87891 Personal history of nicotine dependence: Secondary | ICD-10-CM

## 2018-06-10 DIAGNOSIS — D509 Iron deficiency anemia, unspecified: Secondary | ICD-10-CM | POA: Diagnosis present

## 2018-06-10 LAB — CBC WITH DIFFERENTIAL (CANCER CENTER ONLY)
BASOS ABS: 0 10*3/uL (ref 0.0–0.1)
Basophils Relative: 0 %
Eosinophils Absolute: 0.7 10*3/uL — ABNORMAL HIGH (ref 0.0–0.5)
Eosinophils Relative: 15 %
HEMATOCRIT: 37.5 % (ref 34.8–46.6)
HEMOGLOBIN: 12.3 g/dL (ref 11.6–15.9)
LYMPHS PCT: 34 %
Lymphs Abs: 1.7 10*3/uL (ref 0.9–3.3)
MCH: 31 pg (ref 25.1–34.0)
MCHC: 32.8 g/dL (ref 31.5–36.0)
MCV: 94.5 fL (ref 79.5–101.0)
MONO ABS: 0.6 10*3/uL (ref 0.1–0.9)
MONOS PCT: 12 %
NEUTROS ABS: 1.9 10*3/uL (ref 1.5–6.5)
NEUTROS PCT: 39 %
Platelet Count: 229 10*3/uL (ref 145–400)
RBC: 3.97 MIL/uL (ref 3.70–5.45)
RDW: 17.7 % — ABNORMAL HIGH (ref 11.2–14.5)
WBC Count: 4.9 10*3/uL (ref 3.9–10.3)

## 2018-06-10 LAB — FERRITIN: Ferritin: 21 ng/mL (ref 9–269)

## 2018-06-10 LAB — IRON AND TIBC
IRON: 121 ug/dL (ref 41–142)
Saturation Ratios: 43 % (ref 21–57)
TIBC: 280 ug/dL (ref 236–444)
UIBC: 159 ug/dL

## 2018-06-10 LAB — RETICULOCYTES
RBC.: 3.97 MIL/uL (ref 3.70–5.45)
RETIC COUNT ABSOLUTE: 35.7 10*3/uL (ref 33.7–90.7)
Retic Ct Pct: 0.9 % (ref 0.7–2.1)

## 2018-06-10 MED FILL — KEVZARA 200 MG/1.14ML SOSY: 200 | 28 days supply | Qty: 2 | Fill #0

## 2018-06-10 NOTE — Progress Notes (Signed)
HEMATOLOGY/ONCOLOGY CONSULTATION NOTE  Date of Service: 06/10/18  Patient Care Team: Sapp, Merceda Elks, PA-C as PCP - General (Physician Assistant)  CHIEF COMPLAINTS/PURPOSE OF CONSULTATION:  Anemia  HISTORY OF PRESENTING ILLNESS:   Hayley Burke is a wonderful 32 y.o. female who has been referred to [place] by Oleta Mouse N.P for evaluation and management of anemia. She is accompanied today by her mother and daughter. The pt reports that she is doing well overall.   The pt reports that she sees Dr. Corliss Skains for her RA. She notes that she has taken Metholtrexate 0.8 mL for 6 years.  She was first diagnosed with RA 6 years ago, she has also tried Plaquinil,  Humara, and Enbrel in the past. She notes that she has been on biologics since being diagnosed. She takes 2mg  Folic acid replacement daily. She notes that she receives prednisone during her RA flare ups.   She notes that she first learned of her anemia a year ago. She noticed that her nails were getting more brittle, and that she was more tired.  She notes that she took Ferrous sulfate in the past, during her pregnancy 4 years ago.  She notes that she has ice cravings. She notes that she has not had a period since beginning Sarilumab in January. She notes that she has had normal to slightly heavy before this. She has never had IV iron nor a blood transfusion.   She notes that she takes 2-4 tablets of Ibuprofen daily for 6-7 years for chronic headaches and migraines. She denies any stomach issues and does not take OTC anti acids. She notes that she has no drug allergies besides Sulfa antibiotics which was accompanied by hives.   Most recent lab results (02/22/18) of CBC  is as follows: all values are WNL except for Hgb at 11.0, RDW at 17.6, EOS Abs aty 1.1k. Ferritin 02/22/18 was low at 13. Vitamin B12 02/22/18 was slightly elevated at 1318.  Iron and TIBC 02/22/18 showed Iron low at 15 and Iron Sat low at 5%  On review of systems, pt  reports being tired, occasional mouth sores, and denies blood in the stools, melena, any bleeding, sudden weight loss, fevers, chills, night sweats, noticing any enlarged lymph nodes, abdominal pains, leg swelling and any other symptoms.   On PMHx the pt reports Rheumatoid Arthritis, and anemia. On Surgical Hx the pt reports adenoid surgery, caesarean section with a post-operative hematoma, and tonsilectomy.  On Social Hx the pt denies much ETOH use and quit smoking 5 years ago.  On Family Hx the pt reports her father had lung cancer, her cousins had prostate cancer.  Interval History:   Ambyr Catchings returns today regarding her iron deficiency anemia. The patient's last visit with [place] was on 03/20/18. She is accompanied today by her mother and daughter. The pt reports that she is doing well overall.   The pt reports that she had some stomach discomfort and nausea with her PO iron replacement that she took with food, and subsequently stopped taking this. She notes that since she began [place] she has not had any periods.   She notes that she tolerated IV Injectafer very well and would prefer to replace iron via this route.   Lab results today (06/10/18) of CBC, CMP, and Reticulocytes is as follows: all values are WNL except for RDW at 17.7, Eosinophils abs at 700. Ferritin 06/10/18 is 21 Anti-parietal antibody 03/20/18 was elevated at 28.0  On review of systems,  pt reports good energy levels, and denies regular periods, light headedness, dizziness, and any other symptoms.   MEDICAL HISTORY:  Past Medical History:  Diagnosis Date  . Rheumatoid arthritis (HCC)     SURGICAL HISTORY: Past Surgical History:  Procedure Laterality Date  . CESAREAN SECTION    . TONSILLECTOMY AND ADENOIDECTOMY      SOCIAL HISTORY: Social History   Socioeconomic History  . Marital status: Unknown    Spouse name: Not on file  . Number of children: Not on file  . Years of education: Not on file  . Highest  education level: Not on file  Occupational History  . Not on file  Social Needs  . Financial resource strain: Not on file  . Food insecurity:    Worry: Not on file    Inability: Not on file  . Transportation needs:    Medical: Not on file    Non-medical: Not on file  Tobacco Use  . Smoking status: Former Games developer  . Smokeless tobacco: Never Used  Substance and Sexual Activity  . Alcohol use: No  . Drug use: No  . Sexual activity: Yes  Lifestyle  . Physical activity:    Days per week: Not on file    Minutes per session: Not on file  . Stress: Not on file  Relationships  . Social connections:    Talks on phone: Not on file    Gets together: Not on file    Attends religious service: Not on file    Active member of club or organization: Not on file    Attends meetings of clubs or organizations: Not on file    Relationship status: Not on file  . Intimate partner violence:    Fear of current or ex partner: Not on file    Emotionally abused: Not on file    Physically abused: Not on file    Forced sexual activity: Not on file  Other Topics Concern  . Not on file  Social History Narrative  . Not on file    FAMILY HISTORY: No family history on file.  ALLERGIES:  is allergic to sulfa antibiotics.  MEDICATIONS:  Current Outpatient Medications  Medication Sig Dispense Refill  . aspirin-acetaminophen-caffeine (EXCEDRIN MIGRAINE) 250-250-65 MG tablet Take 1 tablet by mouth every 6 (six) hours as needed for headache.    . Cyanocobalamin (VITAMIN B-12 PO) Take by mouth daily.    . Ferrous Sulfate (IRON) 325 (65 Fe) MG TABS Take 1 tablet by mouth daily.    . folic acid (FOLVITE) 1 MG tablet Take 2 tablets (2 mg total) by mouth daily. 60 tablet 3  . iron polysaccharides (NIFEREX) 150 MG capsule Take 1 capsule (150 mg total) by mouth daily. 30 capsule 3  . KEVZARA 200 MG/1. SOSY INJECT 200 MG INTO THE SKIN EVERY 14 (FOURTEEN) DAYS. 6.84 mL 0  . Levonorgestrel (KYLEENA) 19.5 MG  IUD Kyleena 17.5 mcg/24 hour (5 years) intrauterine device  TO BE INSERTED ONE TIME BY PRESCRIBER. ROUTE INTRAUTERINE.    . methotrexate 250 MG/10ML injection Inject 0.8 mLs (20 mg total) into the skin once a week. *Max 2.2 mLs a week 10 mL 0  . naproxen (NAPROSYN) 500 MG tablet Naprosyn 500 mg tablet  Take 1 tablet twice a day by oral route for 30 days.  take with food. as needed    . predniSONE (DELTASONE) 5 MG tablet Take 4 tabs po x 2 days. 3 tabs po x 2 days, 2 tabs  po x 2 days, 1 tab po x 2 days 20 tablet 0  . pyridOXINE (VITAMIN B-6) 100 MG tablet Take 100 mg by mouth daily.    . TUBERCULIN SYR 1CC/27GX1/2" 27G X 1/2" 1 ML MISC Use 1 weekly to inject Methotrexate 12 each 3   No current facility-administered medications for this visit.     REVIEW OF SYSTEMS:    A 10+ POINT REVIEW OF SYSTEMS WAS OBTAINED including neurology, dermatology, psychiatry, cardiac, respiratory, lymph, extremities, GI, GU, Musculoskeletal, constitutional, breasts, reproductive, HEENT.  All pertinent positives are noted in the HPI.  All others are negative.    PHYSICAL EXAMINATION:  . Vitals:   06/10/18 1551  BP: 118/72  Pulse: 77  Resp: 18  Temp: 98.3 F (36.8 C)  SpO2: 100%   Filed Weights   06/10/18 1551  Weight: 146 lb 4.8 oz (66.4 kg)   .Body mass index is 24.35 kg/m.  GENERAL:alert, in no acute distress and comfortable SKIN: no acute rashes, no significant lesions EYES: conjunctiva are pink and non-injected, sclera anicteric OROPHARYNX: MMM, no exudates, no oropharyngeal erythema or ulceration NECK: supple, no JVD LYMPH:  no palpable lymphadenopathy in the cervical, axillary or inguinal regions LUNGS: clear to auscultation b/l with normal respiratory effort HEART: regular rate & rhythm ABDOMEN:  normoactive bowel sounds , non tender, not distended. Extremity: no pedal edema PSYCH: alert & oriented x 3 with fluent speech NEURO: no focal motor/sensory deficits   LABORATORY DATA:  I  have reviewed the data as listed  . CBC Latest Ref Rng & Units 06/10/2018 03/20/2018 12/06/2017  WBC 3.9 - 10.3 K/uL 4.9 9.5 10.3  Hemoglobin 11.6 - 15.9 g/dL 20.9 10.6(L) 11.5(L)  Hematocrit 34.8 - 46.6 % 37.5 33.5(L) 34.7(L)  Platelets 145 - 400 K/uL 229 404(H) 296  HGB 10.6 ANC 6.1k  . CMP Latest Ref Rng & Units 03/20/2018 12/06/2017 08/24/2017  Glucose 70 - 140 mg/dL 84 75 74  BUN 7 - 26 mg/dL 12 13 13   Creatinine 0.60 - 1.10 mg/dL 4.70 9.62  Sodium 136 - 145 mmol/L 139 140 141  Potassium 3.5 - 5.1 mmol/L 4.1 4.1 4.3  Chloride 98 - 109 mmol/L 108 106 107  CO2 22 - 29 mmol/L 25 26 27   Calcium 8.4 - 10.4 mg/dL 8.5 8.6 8.9  Total Protein 6.4 - 8.3 g/dL 8.36) ) 6.1  Total Bilirubin 0.2 - 1.2 mg/dL 0.3 0.3 0.3  Alkaline Phos 40 - 150 U/L 54 - -  AST 5 - 34 U/L 26 16 17   ALT 0 - 55 U/L 21 12 17    Component     Latest Ref Rng & Units 03/20/2018  Iron     41 - 142 ug/dL 55  TIBC     4.7(M - ug/dL  Saturation Ratios     21 - 57 % 15 (L)  UIBC     ug/dL 05/20/2018  Sed Rate     0 - 22 mm/hr 5  Vitamin D, 25-Hydroxy     30.0 - 100.0 ng/mL 26.0 (L)  Intrinsic Factor     0.0 - 1.1 AU/mL 1.0  Parietal Cell Antibody-IgG     0.0 - 20.0 Units 28.0 (H)  Vitamin B12     180 - 914 pg/mL 1,021 (H)  Ferritin     9 - 269 ng/mL 10    RADIOGRAPHIC STUDIES: I have personally reviewed the radiological images as listed and agreed with the findings in the report. No  results found.  ASSESSMENT & PLAN:  32 y.o. female with  1. Iron Deficiency Anemia -Discussed that her Hgb was around 14 as of 2 years ago, but had dropped to 9.5 in 09/2016. Ferritin was at 13 on 02/22/18.   2. Newly diagnosed Pernicious anemia -  elevated parietal cell antibody PLAN  -Discussed pt labwork today, 06/10/18; HGB normalized to 12.3 -Discussed findings of antiparietal cell antibody persence suggesting pernicious anemia which could limit iron absorption po. -With RA present, would prefer ferritin to be  higher around 100 -Recommend taking Vitamin B complex given antiparietal cell antibody -Continue eating iron rich foods -Slow-Fe OTC -Will see pt back in 4-6 months -Will replace IV Iron if Ferritin stores are <50  (ferritin 21 today -- will setup for one additional dose of IV Injectafer)  -We will let PCP determine if an endoscopy is necessary given the patient's significant NSAID use with anemia and concern for ulcer formation.   2) h/o B12 deficiency . 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 Could be a cause of the Iron and B12 deficiency B12 currentl WNL @ 1021   RTC with Dr Candise Che in 6 months with labs   All of the patients questions were answered with apparent satisfaction. The patient knows to call the clinic with any problems, questions or concerns.  . The total time spent in the appointment was 20 minutes and more than 50% was on counseling and direct patient cares.    Wyvonnia Lora MD MS AAHIVMS [facility]  (Office):       [phone] (Work cell):  [phone] (Fax):           [phone]  06/10/2018 4:26 PM  I, Marcelline Mates, am acting as a Neurosurgeon for Dr Candise Che.   .I have reviewed the above documentation for accuracy and completeness, and I agree with the above. Johney Maine MD

## 2018-06-10 NOTE — Telephone Encounter (Signed)
Appointments scheduled AVS/Calendar printed per 6/24 los °

## 2018-06-17 ENCOUNTER — Telehealth: Payer: Self-pay

## 2018-06-17 NOTE — Telephone Encounter (Signed)
Per in basket message received from Dr. Candise Che call placed to patient to let her know that her ferritin is still low at 21 though her anemia has corrected. As per her discussion with Dr. Candise Che he recommends additional 1 dose of Injectafer to maintain ferritin closer to 100. Scheduling message sent to schedule patient. Spoke to patient and she verbalized understanding and will await a call from scheduling.

## 2018-06-17 NOTE — Telephone Encounter (Signed)
Patient is aware of her appointment at the [facility] on Tuesday 9th. Per 7/1 sch msg

## 2018-06-18 ENCOUNTER — Encounter (HOSPITAL_COMMUNITY): Payer: Medicaid Other

## 2018-06-21 ENCOUNTER — Ambulatory Visit: Payer: Medicaid Other | Admitting: Physician Assistant

## 2018-06-24 ENCOUNTER — Encounter (HOSPITAL_COMMUNITY): Payer: Medicaid Other

## 2018-06-25 ENCOUNTER — Telehealth: Payer: Self-pay | Admitting: Rheumatology

## 2018-06-25 NOTE — Progress Notes (Signed)
Office Visit Note  Patient: Hayley Burke             Date of Birth: July 11, 1986           MRN: [id number]             PCP: Wynell Balloon, PA-C Referring: Wynell Balloon, [place]* Visit Date: 06/26/2018 Occupation: @GUAROCC @     Subjective:  Right foot pain and swelling   History of Present Illness: Hayley Burke is a 32 y.o. female with history of seropositive rheumatoid arthritis.  Patient injects Kevzara every 14 days and injects methotrexate 0.8 mL once weekly.  Patient is due for her next R injection today.  She was originally started on 03/20/2018.  In the past she has been on Humira, Enbrel, and Orencia.  She continues to have a flare about once a month.  She states that 2 days ago she developed a flare in the right foot.  Patient states that she was unable to bear weight on her right foot 2 days ago.  She denies any injury or overuse activities.  She states that she was having sharp pains on the dorsal aspect of her right foot.  She states she also noticed swelling and redness.  She states that she took ibuprofen 800 mg twice daily yesterday which helped with her pain and swelling.  She states that she was able to sleep through the night after taking ibuprofen at bedtime.  Pain and swelling have improved significantly today.  She states she did wake up with some right wrist aching and stiffness.  Denies any swelling in her right wrist.   Activities of Daily Living:  Patient reports morning stiffness for 3 hours.   Patient Reports nocturnal pain.  Difficulty dressing/grooming: Denies Difficulty climbing stairs: Reports Difficulty getting out of chair: Denies Difficulty using hands for taps, buttons, cutlery, and/or writing: Denies   Review of Systems  Constitutional: Positive for fatigue.  HENT: Negative for mouth sores, mouth dryness and nose dryness.   Eyes: Positive for dryness. Negative for pain and visual disturbance.  Respiratory: Negative for cough, hemoptysis, shortness  of breath and difficulty breathing.   Cardiovascular: Negative for chest pain, palpitations and hypertension.  Gastrointestinal: Negative for blood in stool, constipation and diarrhea.  Endocrine: Negative for increased urination.  Genitourinary: Negative for difficulty urinating and painful urination.  Musculoskeletal: Positive for arthralgias, joint pain, joint swelling, muscle weakness, morning stiffness and muscle tenderness. Negative for myalgias and myalgias.  Skin: Positive for rash and redness. Negative for color change, pallor, hair loss, nodules/bumps, skin tightness, ulcers and sensitivity to sunlight.  Allergic/Immunologic: Negative for susceptible to infections.  Neurological: Negative for dizziness and headaches.  Hematological: Negative for bruising/bleeding tendency and swollen glands.  Psychiatric/Behavioral: Positive for sleep disturbance. Negative for depressed mood. The patient is not nervous/anxious.     PMFS History:  Patient Active Problem List   Diagnosis Date Noted  . Iron deficiency anemia 03/20/2018  . History of anemia 05/29/2017  . 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 High risk medications (not anticoagulants) long-term use 05/03/2017  . Pain in left wrist 05/03/2017  . Trigger finger, right middle finger 05/03/2017    Past Medical History:  Diagnosis Date  . Rheumatoid arthritis (HCC)     History reviewed. No pertinent family history. Past Surgical History:  Procedure Laterality Date  . CESAREAN SECTION    . TONSILLECTOMY AND ADENOIDECTOMY  Social History   Social History Narrative  . Not on file     Objective: Vital Signs: BP (!) 96/57 (BP Location: Left Arm, Patient Position: Sitting, Cuff Size: Normal)   Pulse 94   Resp 14   Ht 5\' 5"  (1.651 m)   Wt 146 lb (66.2 kg)   BMI 24.30 kg/m    Physical Exam  Constitutional: She is oriented to person, place,  and time. She appears well-developed and well-nourished.  HENT:  Head: Normocephalic and atraumatic.  Eyes: Conjunctivae and EOM are normal.  Neck: Normal range of motion.  Cardiovascular: Normal rate, regular rhythm, normal heart sounds and intact distal pulses.  Pulmonary/Chest: Effort normal and breath sounds normal.  Abdominal: Soft. Bowel sounds are normal.  Lymphadenopathy:    She has no cervical adenopathy.  Neurological: She is alert and oriented to person, place, and time.  Skin: Skin is warm and dry. Capillary refill takes less than 2 seconds.  Psychiatric: She has a normal mood and affect. Her behavior is normal.  Nursing note and vitals reviewed.    Musculoskeletal Exam: C-spine, thoracic spine, lumbar spine good range of motion.  No midline spinal tenderness.  No SI joint tenderness.  Shoulder joints, elbow joints, wrist joints, MCPs, PIPs, DIPs good range of motion.  She is complete fist formation bilaterally.  She has synovitis of her left first second and third MCP joints.  Hip joints, knee joints, ankle joints, MTPs, PIPs, DIPs good range of motion.  No warmth or effusion of bilateral knee joints.  She has synovitis of the right ankle joint as well as tenderness.  Synovitis and tenderness of right 1st, 2nd, 3rd, and 4th MTP joints. She has warmth and swelling on the dorsal aspect of the right foot.  No tenderness of trochanteric bursa bilaterally.   CDAI Exam: CDAI Homunculus Exam:   Tenderness:  RLE: tibiotalar Right foot: 1st MTP, 2nd MTP, 3rd MTP, 4th MTP and 5th MTP  Swelling:  Left hand: 1st MCP, 2nd MCP and 3rd MCP RLE: tibiotalar Right foot: 1st MTP, 2nd MTP, 3rd MTP and 4th MTP  Joint Counts:  CDAI Tender Joint count: 0 CDAI Swollen Joint count: 3  Global Assessments:  Patient Global Assessment: 4 Provider Global Assessment: 4  CDAI Calculated Score: 11    Investigation: No additional findings. CBC Latest Ref Rng & Units 06/10/2018 03/20/2018  12/06/2017  WBC 3.9 - 10.3 K/uL 4.9 9.5 10.3  Hemoglobin 11.6 - 15.9 g/dL 12/08/2017 10.6(L) 11.5(L)  Hematocrit 34.8 - 46.6 % 37.5 33.5(L) 34.7(L)  Platelets 145 - 400 K/uL 229 404(H) 296   CMP Latest Ref Rng & Units 03/20/2018 12/06/2017 08/24/2017  Glucose 70 - 140 mg/dL 84 75 74  BUN 7 - 26 mg/dL 12 13 13   Creatinine 0.60 - 1.10 mg/dL 10/24/2017 4.62  Sodium 136 - 145 mmol/L 139 140 141  Potassium 3.5 - 5.1 mmol/L 4.1 4.1 4.3  Chloride 98 - 109 mmol/L 108 106 107  CO2 22 - 29 mmol/L 25 26 27   Calcium 8.4 - 10.4 mg/dL 8.5 8.6 8.9  Total Protein 6.4 - 8.3 g/dL 7.03) 5.00) 6.1  Total Bilirubin 0.2 - 1.2 mg/dL 0.3 0.3 0.3  Alkaline Phos 40 - 150 U/L 54 - -  AST 5 - 34 U/L 26 16 17   ALT 0 - 55 U/L 21 12 17      Imaging: No results found.  Speciality Comments: No specialty comments available.    Procedures:  No procedures performed Allergies:  Sulfa antibiotics   Assessment / Plan:     Visit Diagnoses: Rheumatoid arthritis with rheumatoid factor of multiple sites without organ or systems involvement [facility]): She has active synovitis on exam.  She has tenderness and synovitis of her right ankle joint on the dorsal aspect of her right foot.  She also tenderness in all right MTPs as well as synovitis in her right first second third and fourth MTPs.  She has synovitis of her left first, second, and third MCP joints as well.  She was started on 03/20/2018.  She Has not missed any doses of R methotrexate.  She continues to inject methotrexate 0.8 mL subcutaneously once weekly and taking folic acid 2 mg daily.  She is due for her next Injection today.  She has not noticed any benefit since being on Cozaar.  She continues to have rheumatoid arthritis flare once a month.  She would like to discuss switching to another medication besides Kevzara.  She has tried Humira, Enbrel, and Orencia in the past. We discussed the importance of having yearly influenza vaccines as well as receiving the pneumococcal  vaccine. She will skip her injection of Kevzara today. We discussed the indications, contraindications, and potential side effects of Xeljanz.  She would like to discuss Harriette Ohara with her family before starting.  She was advised to notify us if she has any further questions and when she is ready to consent.  She was given a handout with information about Harriette Ohara today.  She is also given a prednisone taper starting at 20 mg and tapering by 5 mg every 4 days.  Future orders for CBC, CMP, and lipid panel were ordered today.  She will return when she is fasting for lab work.    High risk medications (not anticoagulants) long-term use - Kevzara, MTX 0.8 ml sq once weekly, folic acid 2 mg daily.  CBC was drawn on 06/10/2018.  CMP was drawn on 03/20/2018.  TB gold was negative on 03/06/2018.  Future orders for CBC, CMP, and lipid panel were ordered today.  Trigger finger, right middle finger: She declined a cortisone injection at this time.  History of anemia: She continues to have iron infusions.   Orders: No orders of the defined types were placed in this encounter.  No orders of the defined types were placed in this encounter.   Face-to-face time spent with patient was 30 minutes. Greater than 50% of time was spent in counseling and coordination of care.  Follow-Up Instructions: Return in about 3 months (around 09/26/2018) for Rheumatoid arthritis.   Gearldine Bienenstock, PA-C  I examined and evaluated the patient with Sherron Ales PA. The plan of care was discussed as noted above.  Pollyann Savoy, MD Note - This record has been created using Animal nutritionist.  Chart creation errors have been sought, but may not always  have been located. Such creation errors do not reflect on  the standard of medical care.

## 2018-06-25 NOTE — Telephone Encounter (Signed)
Patient called this morning stating that she is having a flare and wanted to know if Dr. Corliss Skains would call her in an RX for Prednisone.  [facility]  CB#[phone]. Thank you.

## 2018-06-25 NOTE — Telephone Encounter (Signed)
Patient states she is having pain her right foot which start yesterday. Patient states it is on the top and side of her gotten and is progressively gotten worse. Patient has been schedule for an appointment 06/26/18 at 9:40 am.

## 2018-06-26 ENCOUNTER — Encounter: Payer: Self-pay | Admitting: Physician Assistant

## 2018-06-26 ENCOUNTER — Ambulatory Visit (INDEPENDENT_AMBULATORY_CARE_PROVIDER_SITE_OTHER): Payer: Medicaid Other | Admitting: Physician Assistant

## 2018-06-26 ENCOUNTER — Telehealth: Payer: Self-pay | Admitting: Rheumatology

## 2018-06-26 VITALS — BP 96/57 | HR 94 | Resp 14 | Ht 65.0 in | Wt 146.0 lb

## 2018-06-26 DIAGNOSIS — Z79899 Other long term (current) drug therapy: Secondary | ICD-10-CM | POA: Diagnosis not present

## 2018-06-26 DIAGNOSIS — M65331 Trigger finger, right middle finger: Secondary | ICD-10-CM

## 2018-06-26 DIAGNOSIS — M0579 Rheumatoid arthritis with rheumatoid factor of multiple sites without organ or systems involvement: Secondary | ICD-10-CM

## 2018-06-26 DIAGNOSIS — Z862 Personal history of diseases of the blood and blood-forming organs and certain disorders involving the immune mechanism: Secondary | ICD-10-CM

## 2018-06-26 MED ORDER — PREDNISONE 5 MG PO TABS: ORAL_TABLET | ORAL | 0 refills | Status: DC

## 2018-06-26 NOTE — Telephone Encounter (Signed)
I spoke with patient to discuss the option of starting on [place] for Cimzia.  She does not feel comfortable with the side effects of Xeljanz and would like to move forward with applying for Cimzia.  I advised her to follow-up in the office to obtain more information about the indications, contraindications, and potential side effects.  We will obtain consent at that time.  She understands that she will come to the office for subcutaneous Cimzia injections.  We will give her a lab schedule at that time.  All questions were addressed.  She will be performing her last of Kevzara today.  A prescription for Prednisone was sent to the pharmacy today.  We will apply for Cimzia and notify patient if it is approved.

## 2018-06-26 NOTE — Telephone Encounter (Signed)
Patient left a voicemail stating that she has decided not try [place].  Patient states does not want to take a drug in the same class as Humira.

## 2018-06-26 NOTE — Patient Instructions (Signed)
Tofacitinib tablets What is this medicine? TOFACITINIB (TOE fa SYE ti nib) is a medicine that works on the immune system. This medicine is used to treat rheumatoid arthritis and psoriatic arthritis. This medicine may be used for other purposes; ask your health care provider or pharmacist if you have questions. COMMON BRAND NAME(S): Xeljanz What should I tell my health care provider before I take this medicine? They need to know if you have any of these conditions: -cancer -diabetes -high cholesterol -immune system problems -infection (especially a virus infection such as chickenpox, cold sores, or herpes) -kidney disease -liver disease -low blood counts, like low white cell, platelet, or red cell counts -stomach or intestine problems -an unusual or allergic reaction to tofacitinib, other medicines, foods, dyes, or preservatives -pregnant or trying to get pregnant -breast-feeding How should I use this medicine? Take this medicine by mouth with a glass of water. Follow the directions on the prescription label. You can take it with or without food. If it upsets your stomach, take it with food. A special MedGuide will be given to you by the pharmacist with each prescription and refill. Be sure to read this information carefully each time. Talk to your pediatrician regarding the use of this medicine in children. Special care may be needed. Overdosage: If you think you have taken too much of this medicine contact a poison control center or emergency room at once. NOTE: This medicine is only for you. Do not share this medicine with others. What if I miss a dose? If you miss a dose, take it as soon as you can. If it is almost time for your next dose, take only that dose. Do not take double or extra doses. What may interact with this medicine? Do not take this medicine with any of the following medications: -azathioprine, cyclosporine, or other immunosuppressive drugs -biologic medicines for  arthritis such as abatacept, adalimumab, anakinra, certolizumab, etanercept, golimumab, infliximab, rituximab, secukinumab, tocilizumab, ustekinumab -live vaccines This medicine may also interact with the following medications: -antiviral medicines for hepatitis, HIV or AIDS -certain medicines for fungal infections like fluconazole, itraconazole, ketoconazole, voriconazole -certain medicines for seizures like carbamazepine, phenobarbital, phenytoin -rifampin -supplements, such as St. John's wort This list may not describe all possible interactions. Give your health care provider a list of all the medicines, herbs, non-prescription drugs, or dietary supplements you use. Also tell them if you smoke, drink alcohol, or use illegal drugs. Some items may interact with your medicine. What should I watch for while using this medicine? Tell your doctor or healthcare professional if your symptoms do not start to get better or if they get worse. Avoid taking products that contain aspirin, acetaminophen, ibuprofen, naproxen, or ketoprofen unless instructed by your doctor. These medicines may hide a fever. Call your doctor or health care professional for advice if you get a fever, chills or sore throat, or other symptoms of a cold or flu. Do not treat yourself. This drug decreases your body's ability to fight infections. Try to avoid being around people who are sick. What side effects may I notice from receiving this medicine? Side effects that you should report to your doctor or health care professional as soon as possible: -allergic reactions like skin rash, itching or hives, swelling of the face, lips, or tongue -breathing problems -dizziness -signs of infection - fever or chills, cough, sore throat, pain or trouble passing urine -signs and symptoms of liver injury like dark yellow or brown urine; general ill feeling or flu-like   symptoms; light-colored stools; loss of appetite; nausea; right upper belly  pain; unusually weak or tired; yellowing of the eyes or skin -stomach pain or a sudden change in bowel habits -unusually weak or tired Side effects that usually do not require medical attention (report to your doctor or health care professional if they continue or are bothersome): -diarrhea -headache -muscle aches -runny nose -sinus trouble This list may not describe all possible side effects. Call your doctor for medical advice about side effects. You may report side effects to FDA at 1-800-FDA-1088. Where should I keep my medicine? Keep out of the reach of children. Store between 20 and 25 degrees C (68 and 77 degrees F). Throw away any unused medicine after the expiration date. NOTE: This sheet is a summary. It may not cover all possible information. If you have questions about this medicine, talk to your doctor, pharmacist, or health care provider.  2018 Elsevier/Gold Standard (2016-12-22 11:43:53)  

## 2018-06-27 ENCOUNTER — Telehealth: Payer: Self-pay

## 2018-06-27 NOTE — Telephone Encounter (Signed)
Patient called wanting to reschedule iron infusion appointment that she cancelled on 07/04/18. Scheduling message sent.

## 2018-06-28 ENCOUNTER — Telehealth: Payer: Self-pay | Admitting: Rheumatology

## 2018-06-28 NOTE — Telephone Encounter (Signed)
Pt called left VM would like to know if she needs to come in to sign paperwork for new med or wait until she comes for lab work

## 2018-07-01 ENCOUNTER — Other Ambulatory Visit: Payer: Self-pay

## 2018-07-01 DIAGNOSIS — Z79899 Other long term (current) drug therapy: Secondary | ICD-10-CM

## 2018-07-01 LAB — CBC WITH DIFFERENTIAL/PLATELET
BASOS PCT: 0.6 %
Basophils Absolute: 29 cells/uL (ref 0–200)
Eosinophils Absolute: 72 cells/uL (ref 15–500)
Eosinophils Relative: 1.5 %
HCT: 35.4 % (ref 35.0–45.0)
Hemoglobin: 12.1 g/dL (ref 11.7–15.5)
Lymphs Abs: 845 cells/uL — ABNORMAL LOW (ref 850–3900)
MCH: 32 pg (ref 27.0–33.0)
MCHC: 34.2 g/dL (ref 32.0–36.0)
MCV: 93.7 fL (ref 80.0–100.0)
MONOS PCT: 5.2 %
MPV: 12.6 fL — AB (ref 7.5–12.5)
Neutro Abs: 3605 cells/uL (ref 1500–7800)
Neutrophils Relative %: 75.1 %
PLATELETS: 271 10*3/uL (ref 140–400)
RBC: 3.78 10*6/uL — AB (ref 3.80–5.10)
RDW: 14.4 % (ref 11.0–15.0)
TOTAL LYMPHOCYTE: 17.6 %
WBC: 4.8 10*3/uL (ref 3.8–10.8)
WBCMIX: 250 {cells}/uL (ref 200–950)

## 2018-07-01 LAB — COMPLETE METABOLIC PANEL WITH GFR
AG Ratio: 2.4 (calc) (ref 1.0–2.5)
ALT: 15 U/L (ref 6–29)
AST: 16 U/L (ref 10–30)
Albumin: 4 g/dL (ref 3.6–5.1)
Alkaline phosphatase (APISO): 41 U/L (ref 33–115)
BUN: 16 mg/dL (ref 7–25)
CALCIUM: 8.7 mg/dL (ref 8.6–10.2)
CO2: 25 mmol/L (ref 20–32)
Chloride: 109 mmol/L (ref 98–110)
Creat: 0.83 mg/dL (ref 0.50–1.10)
GFR, EST AFRICAN AMERICAN: 108 mL/min/{1.73_m2} (ref >=60)
GFR, EST NON AFRICAN AMERICAN: 93 mL/min/{1.73_m2} (ref >=60)
GLUCOSE: 107 mg/dL — AB (ref 65–99)
Globulin: 1.7 g/dL (calc) — ABNORMAL LOW (ref 1.9–3.7)
Potassium: 4.4 mmol/L (ref 3.5–5.3)
Sodium: 142 mmol/L (ref 135–146)
TOTAL PROTEIN: 5.7 g/dL — AB (ref 6.1–8.1)
Total Bilirubin: 0.3 mg/dL (ref 0.2–1.2)

## 2018-07-01 NOTE — Telephone Encounter (Signed)
Patient advised she will need to come by and sign consent for the Cimzia. Patient verbalized understanding.

## 2018-07-02 ENCOUNTER — Telehealth: Payer: Self-pay

## 2018-07-02 NOTE — Progress Notes (Signed)
RBC borderline low.  Hgb and Hct WNL. Labs are stable.

## 2018-07-02 NOTE — Telephone Encounter (Signed)
Spoke with patient and rescheduled appointment. Per 7/15 sch msg

## 2018-07-03 ENCOUNTER — Encounter (HOSPITAL_COMMUNITY): Payer: Medicaid Other

## 2018-07-08 ENCOUNTER — Ambulatory Visit (HOSPITAL_COMMUNITY)
Admission: RE | Admit: 2018-07-08 | Discharge: 2018-07-08 | Disposition: A | Discharge status: Home / Self Care | Payer: Medicaid Other | Source: Ambulatory Visit | Attending: Hematology | Admitting: Hematology

## 2018-07-08 DIAGNOSIS — D509 Iron deficiency anemia, unspecified: Secondary | ICD-10-CM | POA: Insufficient documentation

## 2018-07-08 MED ORDER — SODIUM CHLORIDE 0.9 % IV SOLN
Freq: Once | INTRAVENOUS | Status: AC
  Administered 2018-07-08: 10 mL/h via INTRAVENOUS

## 2018-07-08 MED ORDER — FERRIC CARBOXYMALTOSE 750 MG/15ML IV SOLN
750.0000 mg | Freq: Once | INTRAVENOUS | Status: AC
  Administered 2018-07-08: 750 mg via INTRAVENOUS
  Filled 2018-07-08: qty 15

## 2018-07-08 NOTE — Discharge Instructions (Signed)
Ferric carboxymaltose injection What is this medicine? FERRIC CARBOXYMALTOSE (ferr-ik car-box-ee-mol-toes) is an iron complex. Iron is used to make healthy red blood cells, which carry oxygen and nutrients throughout the body. This medicine is used to treat anemia in people with chronic kidney disease or people who cannot take iron by mouth. This medicine may be used for other purposes; ask your health care provider or pharmacist if you have questions. COMMON BRAND NAME(S): Injectafer What should I tell my health care provider before I take this medicine? They need to know if you have any of these conditions: -anemia not caused by low iron levels -high levels of iron in the blood -liver disease -an unusual or allergic reaction to iron, other medicines, foods, dyes, or preservatives -pregnant or trying to get pregnant -breast-feeding How should I use this medicine? This medicine is for infusion into a vein. It is given by a health care professional in a hospital or clinic setting. Talk to your pediatrician regarding the use of this medicine in children. Special care may be needed. Overdosage: If you think you have taken too much of this medicine contact a poison control center or emergency room at once. NOTE: This medicine is only for you. Do not share this medicine with others. What if I miss a dose? It is important not to miss your dose. Call your doctor or health care professional if you are unable to keep an appointment. What may interact with this medicine? Do not take this medicine with any of the following medications: -deferoxamine -dimercaprol -other iron products This medicine may also interact with the following medications: -chloramphenicol -deferasirox This list may not describe all possible interactions. Give your health care provider a list of all the medicines, herbs, non-prescription drugs, or dietary supplements you use. Also tell them if you smoke, drink alcohol, or use  illegal drugs. Some items may interact with your medicine. What should I watch for while using this medicine? Visit your doctor or health care professional regularly. Tell your doctor if your symptoms do not start to get better or if they get worse. You may need blood work done while you are taking this medicine. You may need to follow a special diet. Talk to your doctor. Foods that contain iron include: whole grains/cereals, dried fruits, beans, or peas, leafy green vegetables, and organ meats (liver, kidney). What side effects may I notice from receiving this medicine? Side effects that you should report to your doctor or health care professional as soon as possible: -allergic reactions like skin rash, itching or hives, swelling of the face, lips, or tongue -breathing problems -changes in blood pressure -feeling faint or lightheaded, falls -flushing, sweating, or hot feelings Side effects that usually do not require medical attention (report to your doctor or health care professional if they continue or are bothersome): -changes in taste -constipation -dizziness -headache -nausea -pain, redness, or irritation at site where injected -vomiting This list may not describe all possible side effects. Call your doctor for medical advice about side effects. You may report side effects to FDA at 1-800-FDA-1088. Where should I keep my medicine? This drug is given in a hospital or clinic and will not be stored at home. NOTE: This sheet is a summary. It may not cover all possible information. If you have questions about this medicine, talk to your doctor, pharmacist, or health care provider.  2018 Elsevier/Gold Standard (2016-01-06 11:20:47)  

## 2018-07-08 NOTE — Progress Notes (Signed)
Patient received Injectifar via PIV. Observed for at least 30 minutes post infusion. Tolerated well, vitals stable, discharge instructions given, verbalized understanding. Patient alert, oriented and ambulatory at the time of discharge.  

## 2018-07-10 ENCOUNTER — Encounter (HOSPITAL_COMMUNITY): Payer: Medicaid Other

## 2018-07-15 ENCOUNTER — Telehealth: Payer: Self-pay | Admitting: Rheumatology

## 2018-07-15 MED ORDER — METHOTREXATE SODIUM CHEMO INJECTION 250 MG/10ML: INTRAMUSCULAR | 0 refills | Status: DC

## 2018-07-15 NOTE — Telephone Encounter (Signed)
Patient request a RX for her MTX sent to [place] if [place] will cover it out of state. [facility] in [place], [place] [phone]. Please call to advise. If rx not covered, please call into [facility] in [place].

## 2018-07-15 NOTE — Telephone Encounter (Signed)
Advised patient that Medicaid probably will not cover a prescription in a different state. Patient verbalized understanding and requested it be sent to [facility].   Last visit: 06/26/2018 Next visit: 09/26/2018 Labs: 07/01/2018 RBC borderline low. Hgb and Hct WNL. Labs are stable.  Okay to refill per Dr. Corliss Skains.

## 2018-07-16 ENCOUNTER — Telehealth: Payer: Self-pay | Admitting: Pharmacy Technician

## 2018-07-16 NOTE — Telephone Encounter (Signed)
Patient interested in changing to Cimzia. Prior Authorization has been submitted to patient's insurance via Phone. Will update once we receive a response. Expected 24 hour turn around time.  PA# [id number] Phone# [phone] Spoke to Orpha Bur Ref# [id number]  9:50 AM Dorthula Nettles, CPhT

## 2018-07-31 NOTE — Telephone Encounter (Signed)
Received a fax from [employer] regarding a Benefits Investigation for Cimzia for [employer]. Per BIV, No Prior Authorization is required for medical billing, using CPT codes 46568 or [phone]. Payer suggested coding is: [id number] and [id number]. Both CPT codes are valid and billable with a 0% coinsurance.  Insurance: Medicaid [place]  Will send document to scan center.  Case Manager- [facility] Phone # [phone], ext. [phone]  2:20 PM Jeran Hiltz Johnney Ou, CPhT

## 2018-08-02 NOTE — Telephone Encounter (Signed)
Checked status on [employer] online portal regarding a prior authorization for CIMZIA. Authorization has been APPROVED from 07/10/18 to 07/11/19   Ran test claim- $3.00 copay (2 for 28 days)  Will send document to scan center.  Will need rx sent to pharmacy.  Authorization # [id number] Phone # [phone]  11:00 AM Dorthula Nettles, CPhT

## 2018-08-07 NOTE — Telephone Encounter (Signed)
Per Dr. Corliss Skains she wants patient to receive injections a the hospital.

## 2018-08-16 ENCOUNTER — Telehealth: Payer: Self-pay | Admitting: Rheumatology

## 2018-08-16 MED ORDER — CERTOLIZUMAB PEGOL 6 X 200 MG/ML ~~LOC~~ KIT: 400.0000 mg | PACK | SUBCUTANEOUS | 0 refills | Status: DC

## 2018-08-16 MED ORDER — PREDNISONE 5 MG PO TABS: ORAL_TABLET | ORAL | 0 refills | Status: DC

## 2018-08-16 NOTE — Telephone Encounter (Signed)
Patient called stating she is having a flair-up in her left shoulder and requesting a prescription of Prednisone to be sent to  [facility] on [address]   Patient is requesting a return call to let her know we were able to send in the prescription and also to update her on the prior authorization for Cimzia.

## 2018-08-16 NOTE — Telephone Encounter (Signed)
Patient advised prescription for PRednisone has been sent to the pharmacy. Patient advised we may be able to get her in office for

## 2018-08-16 NOTE — Telephone Encounter (Signed)
Okay to send prednisone taper for right now.  I spoke with Amber and she will try to arrange the Cimzia subcu delivered from the specialty pharmacy to [place] so she can be administered Cimzia the in the office.

## 2018-08-20 ENCOUNTER — Telehealth: Payer: Self-pay | Admitting: Pharmacist

## 2018-08-20 ENCOUNTER — Telehealth: Payer: Self-pay | Admitting: Pharmacy Technician

## 2018-08-20 NOTE — Telephone Encounter (Signed)
error 

## 2018-08-20 NOTE — Telephone Encounter (Signed)
Follow up for Cimzia Counseling.  Patient to come in office tomorrow for initial dose.  She had questions about the reason for giving in office.  Explained that since she has failed so many other biologic medications, we want to do everything possible to ensure the medication works appropriately.  It would also allow closer monitoring of adherence and response.  Patient voiced understanding.  She did state that she has been off Kevzara for a month and has noticed that despite previous thoughts, the medication was helping some.  She is currently on methotrexate and a prednisone.  Reports hair loss and was wondering if the biologic agent helps block the adverse effects of methotrexate.  Explained that they do not block the adverse effects and that it is more likely due to her having a flare and the stress it causes on the body.  Encouraged patient to call with any other questions. Patient will need to sign consent form at next visit. Will follow up in 3 months.

## 2018-08-21 ENCOUNTER — Ambulatory Visit (INDEPENDENT_AMBULATORY_CARE_PROVIDER_SITE_OTHER): Payer: Medicaid Other | Admitting: Physician Assistant

## 2018-08-21 ENCOUNTER — Other Ambulatory Visit: Payer: Self-pay | Admitting: Physician Assistant

## 2018-08-21 VITALS — BP 126/82 | HR 81

## 2018-08-21 DIAGNOSIS — M0579 Rheumatoid arthritis with rheumatoid factor of multiple sites without organ or systems involvement: Secondary | ICD-10-CM

## 2018-08-21 MED ORDER — METHOTREXATE SODIUM CHEMO INJECTION 250 MG/10ML: INTRAMUSCULAR | 0 refills | Status: DC

## 2018-08-21 MED ORDER — "TUBERCULIN SYRINGE 27G X 1/2"" 1 ML MISC": 3 refills | Status: DC

## 2018-08-21 MED ORDER — CERTOLIZUMAB PEGOL 2 X 200 MG/ML ~~LOC~~ KIT
400.0000 mg | PACK | Freq: Once | SUBCUTANEOUS | Status: AC
  Administered 2018-08-21: 400 mg via SUBCUTANEOUS

## 2018-08-21 NOTE — Progress Notes (Signed)
Patient presented in office today for new start Cimzia. Patient was injected in both right and left thighs. Patient tolerated the injection well and was observed in the office for 30 minutes. No adverse reactions were noted.

## 2018-08-29 ENCOUNTER — Telehealth: Payer: Self-pay | Admitting: Pharmacy Technician

## 2018-08-29 NOTE — Telephone Encounter (Signed)
Spoke to patient and scheduled next [facility] nurse visit for 09/04/18 @ 3pm.   10:08 AM Dorthula Nettles, CPhT

## 2018-08-30 MED FILL — CIMZIA 200 MG/ML STARTER KI: 6 X 200 | 30 days supply | Qty: 3 | Fill #0

## 2018-09-03 NOTE — Telephone Encounter (Signed)
Patient's Cimzia SQ has been received from [facility] and placed in fridge. Nurse visit is scheduled for tomorrow, 09/04/18 @ 3pm.  1:38 PM Dorthula Nettles, CPhT

## 2018-09-04 ENCOUNTER — Ambulatory Visit (INDEPENDENT_AMBULATORY_CARE_PROVIDER_SITE_OTHER): Payer: Medicaid Other | Admitting: *Deleted

## 2018-09-04 DIAGNOSIS — M0579 Rheumatoid arthritis with rheumatoid factor of multiple sites without organ or systems involvement: Secondary | ICD-10-CM | POA: Diagnosis not present

## 2018-09-04 MED ORDER — CERTOLIZUMAB PEGOL 2 X 200 MG/ML ~~LOC~~ KIT
400.0000 mg | PACK | Freq: Once | SUBCUTANEOUS | Status: AC
  Administered 2018-09-04: 400 mg via SUBCUTANEOUS

## 2018-09-04 NOTE — Progress Notes (Signed)
Patient in office for her second Cimzia injection. Patient provided medication. Patient received injections in right and left lower abdomen. Patient tolerated injections well. Patient will return in two weeks for next injection.   Administrations This Visit    Certolizumab Pegol KIT 400 mg    Admin Date 09/04/2018 Action Given Dose 400 mg Route Subcutaneous Administered By Carole Binning, LPN

## 2018-09-12 NOTE — Progress Notes (Deleted)
Office Visit Note  Patient: Hayley Burke             Date of Birth: Nov 19, 1986           MRN: [id number]             PCP: Wynell Balloon, PA-C Referring: Wynell Balloon, PA* Visit Date: 09/26/2018 Occupation: @GUAROCC @  Subjective:  No chief complaint on file.   History of Present Illness: Hayley Burke is a 32 y.o. female ***   Activities of Daily Living:  Patient reports morning stiffness for *** {minute/hour:19697}.   Patient {ACTIONS;DENIES/REPORTS:21021675::"Denies"} nocturnal pain.  Difficulty dressing/grooming: {ACTIONS;DENIES/REPORTS:21021675::"Denies"} Difficulty climbing stairs: {ACTIONS;DENIES/REPORTS:21021675::"Denies"} Difficulty getting out of chair: {ACTIONS;DENIES/REPORTS:21021675::"Denies"} Difficulty using hands for taps, buttons, cutlery, and/or writing: {ACTIONS;DENIES/REPORTS:21021675::"Denies"}  No Rheumatology ROS completed.   PMFS History:  Patient Active Problem List   Diagnosis Date Noted  . Iron deficiency anemia 03/20/2018  . History of anemia 05/29/2017  . 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 High risk medications (not anticoagulants) long-term use 05/03/2017  . Pain in left wrist 05/03/2017  . Trigger finger, right middle finger 05/03/2017    Past Medical History:  Diagnosis Date  . Rheumatoid arthritis (HCC)     No family history on file. Past Surgical History:  Procedure Laterality Date  . CESAREAN SECTION    . TONSILLECTOMY AND ADENOIDECTOMY     Social History   Social History Narrative  . Not on file    Objective: Vital Signs: There were no vitals taken for this visit.   Physical Exam   Musculoskeletal Exam: ***  CDAI Exam: CDAI Score: Not documented Patient Global Assessment: Not documented; Provider Global Assessment: Not documented Swollen: Not documented; Tender: Not documented Joint Exam   Not documented   There is  currently no information documented on the homunculus. Go to the Rheumatology activity and complete the homunculus joint exam.  Investigation: No additional findings.  Imaging: No results found.  Recent Labs: Lab Results  Component Value Date   WBC 4.8 07/01/2018   HGB 12.1 07/01/2018   PLT 271 07/01/2018   NA 142 07/01/2018   K 4.4 07/01/2018   CL 109 07/01/2018   CO2 25 07/01/2018   GLUCOSE 107 (H) 07/01/2018   BUN 16 07/01/2018   CREATININE 0.83 07/01/2018   BILITOT 0.3 07/01/2018   ALKPHOS 54 03/20/2018   AST 16 07/01/2018   ALT 15 07/01/2018   PROT 5.7 (L) 07/01/2018   ALBUMIN 3.3 (L) 03/20/2018   CALCIUM 8.7 07/01/2018   GFRAA 108 07/01/2018   QFTBGOLDPLUS NEGATIVE 03/11/2018    Speciality Comments: No specialty comments available.  Procedures:  No procedures performed Allergies: Sulfa antibiotics   Assessment / Plan:     Visit Diagnoses: Rheumatoid arthritis with rheumatoid factor of multiple sites without organ or systems involvement (HCC)  High risk medications (not anticoagulants) long-term use  Trigger finger, right middle finger  History of anemia   Orders: No orders of the defined types were placed in this encounter.  No orders of the defined types were placed in this encounter.   Face-to-face time spent with patient was *** minutes. Greater than 50% of time was spent in counseling and coordination of care.  Follow-Up Instructions: No follow-ups on file.   03/13/2018, PA-C  Note - This record has been created using Dragon software.  Chart creation errors have been sought, but may not always  have been located.  Such creation errors do not reflect on  the standard of medical care.

## 2018-09-18 ENCOUNTER — Ambulatory Visit: Payer: Medicaid Other

## 2018-09-19 ENCOUNTER — Ambulatory Visit (INDEPENDENT_AMBULATORY_CARE_PROVIDER_SITE_OTHER): Payer: Medicaid Other | Admitting: *Deleted

## 2018-09-19 VITALS — BP 111/83 | HR 83

## 2018-09-19 DIAGNOSIS — Z79899 Other long term (current) drug therapy: Secondary | ICD-10-CM

## 2018-09-19 DIAGNOSIS — M0579 Rheumatoid arthritis with rheumatoid factor of multiple sites without organ or systems involvement: Secondary | ICD-10-CM

## 2018-09-19 MED ORDER — CERTOLIZUMAB PEGOL 2 X 200 MG/ML ~~LOC~~ KIT: 400.0000 mg | PACK | Freq: Once | SUBCUTANEOUS | Status: DC

## 2018-09-19 MED ORDER — CERTOLIZUMAB PEGOL 2 X 200 MG/ML ~~LOC~~ KIT
400.0000 mg | PACK | Freq: Once | SUBCUTANEOUS | Status: AC
  Administered 2018-09-19: 400 mg via SUBCUTANEOUS

## 2018-09-19 NOTE — Progress Notes (Signed)
Patient in office for a Cimzia injection. Patient denies any infection, fever or being on antibiotics. Patient given injection in right and left lower abdomen. Patient tolerated injections well. Patient due for next injection October 17, 2018. Patient had labs drawn today.  Patient supplied medication.   Administrations This Visit    Certolizumab Pegol KIT 400 mg    Admin Date 09/19/2018 Action Given Dose 400 mg Route Subcutaneous Administered By Carole Binning, LPN

## 2018-09-20 LAB — CBC WITH DIFFERENTIAL/PLATELET
Basophils Absolute: 40 cells/uL (ref 0–200)
Basophils Relative: 0.8 %
Eosinophils Absolute: 760 cells/uL — ABNORMAL HIGH (ref 15–500)
Eosinophils Relative: 15.2 %
HCT: 38.8 % (ref 35.0–45.0)
HEMOGLOBIN: 12.9 g/dL (ref 11.7–15.5)
LYMPHS ABS: 1365 {cells}/uL (ref 850–3900)
MCH: 32 pg (ref 27.0–33.0)
MCHC: 33.2 g/dL (ref 32.0–36.0)
MCV: 96.3 fL (ref 80.0–100.0)
MPV: 11.4 fL (ref 7.5–12.5)
Monocytes Relative: 7.5 %
NEUTROS PCT: 49.2 %
Neutro Abs: 2460 cells/uL (ref 1500–7800)
Platelets: 246 10*3/uL (ref 140–400)
RBC: 4.03 10*6/uL (ref 3.80–5.10)
RDW: 12.5 % (ref 11.0–15.0)
Total Lymphocyte: 27.3 %
WBC mixed population: 375 cells/uL (ref 200–950)
WBC: 5 10*3/uL (ref 3.8–10.8)

## 2018-09-20 LAB — COMPLETE METABOLIC PANEL WITH GFR
AG RATIO: 2.4 (calc) (ref 1.0–2.5)
ALBUMIN MSPROF: 3.9 g/dL (ref 3.6–5.1)
ALKALINE PHOSPHATASE (APISO): 43 U/L (ref 33–115)
ALT: 17 U/L (ref 6–29)
AST: 19 U/L (ref 10–30)
BILIRUBIN TOTAL: 0.3 mg/dL (ref 0.2–1.2)
BUN: 15 mg/dL (ref 7–25)
CHLORIDE: 107 mmol/L (ref 98–110)
CO2: 26 mmol/L (ref 20–32)
Calcium: 8.4 mg/dL — ABNORMAL LOW (ref 8.6–10.2)
Creat: 0.85 mg/dL (ref 0.50–1.10)
GFR, Est African American: 105 mL/min/{1.73_m2} (ref >=60)
GFR, Est Non African American: 91 mL/min/{1.73_m2} (ref >=60)
Globulin: 1.6 g/dL (calc) — ABNORMAL LOW (ref 1.9–3.7)
Glucose, Bld: 84 mg/dL (ref 65–99)
Potassium: 3.9 mmol/L (ref 3.5–5.3)
SODIUM: 140 mmol/L (ref 135–146)
Total Protein: 5.5 g/dL — ABNORMAL LOW (ref 6.1–8.1)

## 2018-09-20 NOTE — Progress Notes (Signed)
Ca is low. She should be on calcium supplement . Total Ca intake 1200 mg po qd (dietary+ supplement)

## 2018-09-26 ENCOUNTER — Ambulatory Visit: Payer: Medicaid Other | Admitting: Physician Assistant

## 2018-10-17 ENCOUNTER — Ambulatory Visit: Payer: Medicaid Other

## 2018-10-21 ENCOUNTER — Ambulatory Visit (INDEPENDENT_AMBULATORY_CARE_PROVIDER_SITE_OTHER): Payer: Medicaid Other | Admitting: *Deleted

## 2018-10-21 VITALS — BP 134/80 | HR 84

## 2018-10-21 DIAGNOSIS — M0579 Rheumatoid arthritis with rheumatoid factor of multiple sites without organ or systems involvement: Secondary | ICD-10-CM

## 2018-10-21 MED ORDER — CERTOLIZUMAB PEGOL 2 X 200 MG/ML ~~LOC~~ KIT
400.0000 mg | PACK | Freq: Once | SUBCUTANEOUS | Status: AC
  Administered 2018-10-21: 400 mg via SUBCUTANEOUS

## 2018-10-21 NOTE — Progress Notes (Signed)
Patient in office for Cimzia injection. Patient denies any infection, fever or use of antibiotics. Patient provided medication. Patient was given injections in right and left lower abdomen. Patient tolerated injections well. Patient to return in 4 weeks for next injections.   Administrations This Visit    Certolizumab Pegol KIT 400 mg    Admin Date 10/21/2018 Action Given Dose 400 mg Route Subcutaneous Administered By Carole Binning, LPN

## 2018-10-22 ENCOUNTER — Ambulatory Visit: Payer: Medicaid Other | Admitting: Family Medicine

## 2018-10-29 NOTE — Progress Notes (Signed)
Office Visit Note  Patient: Hayley Burke             Date of Birth: 03/11/1986           MRN: [id number]             PCP: Ellwood Dense, DO Referring: Wynell Balloon, [place]* Visit Date: 11/12/2018 Occupation: @GUAROCC @  Subjective:    History of Present Illness: Hayley Burke is a 32 y.o. female with history of seropositive rheumatoid arthritis.  She is on Cimzia sq injections once monthly and MTX 0.8 ml sq once weekly.  She has been tolerating the medications well.  She denies missing any doses recently.  She denies any recent RA flares.  She denies any joint pain or joint swelling at this time.  She denies any morning stiffness. She denies any rheumatoid nodules.   She states that she noticed a small nodule 2 months ago in the left armpit.  She states it became inflamed and tender and she developed one in the right armpit as well. She states she tried switching deodorant and used a new razor.  She states she developed a few in the left armpit which are improving.  She denies any drainage or fevers.  She states her mother gets these same type of nodules.    Activities of Daily Living:  Patient reports morning stiffness for 0 minutes.   Patient Denies nocturnal pain.  Difficulty dressing/grooming: Denies Difficulty climbing stairs: Denies Difficulty getting out of chair: Denies Difficulty using hands for taps, buttons, cutlery, and/or writing: Denies  Review of Systems  Constitutional: Negative for fatigue.  HENT: Negative for mouth sores, trouble swallowing, trouble swallowing, mouth dryness and nose dryness.   Eyes: Negative for pain, redness, itching, visual disturbance and dryness.  Respiratory: Negative for cough, hemoptysis, shortness of breath, wheezing and difficulty breathing.   Cardiovascular: Negative for chest pain, palpitations, hypertension and swelling in legs/feet.  Gastrointestinal: Negative for abdominal pain, blood in stool, constipation, diarrhea, nausea and  vomiting.  Endocrine: Negative for increased urination.  Genitourinary: Negative for painful urination, nocturia and pelvic pain.  Musculoskeletal: Negative for arthralgias, joint pain, joint swelling, myalgias, muscle weakness, morning stiffness, muscle tenderness and myalgias.  Skin: Positive for nodules/bumps. Negative for color change, pallor, rash, hair loss, skin tightness, ulcers and sensitivity to sunlight.  Allergic/Immunologic: Negative for susceptible to infections.  Neurological: Positive for headaches. Negative for dizziness, light-headedness, numbness, memory loss and weakness.  Hematological: Negative for swollen glands.  Psychiatric/Behavioral: Negative for depressed mood, confusion and sleep disturbance. The patient is not nervous/anxious.     PMFS History:  Patient Active Problem List   Diagnosis Date Noted  . Iron deficiency anemia 03/20/2018  . History of anemia 05/29/2017  . 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 High risk medications (not anticoagulants) long-term use 05/03/2017  . Pain in left wrist 05/03/2017  . Trigger finger, right middle finger 05/03/2017  . Family history of breast cancer 01/19/2016    Past Medical History:  Diagnosis Date  . Rheumatoid arthritis (HCC)     Family History  Problem Relation Age of Onset  . Sjogren's syndrome Mother   . Breast cancer Mother   . Depression Mother   . Lung cancer Father   . Hyperlipidemia Father   . Drug abuse Brother   . Heart attack Paternal Grandfather    Past Surgical History:  Procedure Laterality Date  . CESAREAN SECTION    .  TONSILLECTOMY AND ADENOIDECTOMY     Social History   Social History Narrative  . Not on file    Objective: Vital Signs: BP 111/67 (BP Location: Left Arm, Patient Position: Sitting, Cuff Size: Normal)   Pulse 86   Resp 15   Ht 5\' 5"  (1.651 m)   Wt 156 lb 12.8 oz (71.1 kg)   BMI 26.09  kg/m    Physical Exam  Constitutional: She is oriented to person, place, and time. She appears well-developed and well-nourished.  HENT:  Head: Normocephalic and atraumatic.  Eyes: Conjunctivae and EOM are normal.  Neck: Normal range of motion.  Cardiovascular: Normal rate, regular rhythm, normal heart sounds and intact distal pulses.  Pulmonary/Chest: Effort normal and breath sounds normal.  Abdominal: Soft. Bowel sounds are normal.  Lymphadenopathy:    She has no cervical adenopathy.  Neurological: She is alert and oriented to person, place, and time.  Skin: Skin is warm and dry. Capillary refill takes less than 2 seconds.  Psychiatric: She has a normal mood and affect. Her behavior is normal.  Nursing note and vitals reviewed.    Musculoskeletal Exam: C-spine, thoracic spine, and lumbar spine good ROM. No midline spinal tenderness.  No SI joint tenderness.  Shoulder joints, elbow joints, wrist joints, MCPs, PIPs, and DIPs good ROM with no synovitis.  Hip joints, knee joints, ankle joints, MTPs, PIPs, and DIPs good ROM with no synovitis.  No warmth or effusion of knee joints.  No tenderness or swelling of ankle joints. Tenderness of the left trochanteric bursa.   CDAI Exam: CDAI Score: 0.2  Patient Global Assessment: 1 (mm); Provider Global Assessment: 1 (mm) Swollen: 0 ; Tender: 0  Joint Exam   Not documented   There is currently no information documented on the homunculus. Go to the Rheumatology activity and complete the homunculus joint exam.  Investigation: No additional findings.  Imaging: No results found.  Recent Labs: Lab Results  Component Value Date   WBC 5.0 09/19/2018   HGB 12.9 09/19/2018   PLT 246 09/19/2018   NA 140 09/19/2018   K 3.9 09/19/2018   CL 107 09/19/2018   CO2 26 09/19/2018   GLUCOSE 84 09/19/2018   BUN 15 09/19/2018   CREATININE 0.85 09/19/2018   BILITOT 0.3 09/19/2018   ALKPHOS 54 03/20/2018   AST 19 09/19/2018   ALT 17 09/19/2018    PROT 5.5 (L) 09/19/2018   ALBUMIN 3.3 (L) 03/20/2018   CALCIUM 8.4 (L) 09/19/2018   GFRAA 105 09/19/2018   QFTBGOLDPLUS NEGATIVE 03/11/2018    Speciality Comments: No specialty comments available.  Procedures:  No procedures performed Allergies: Sulfa antibiotics     Assessment / Plan:     Visit Diagnoses: Rheumatoid arthritis with rheumatoid factor of multiple sites without organ or systems involvement (HCC) - She has tried Humira, Enbrel, Orencia and Kevzara in the past: She has no synovitis on exam.  She has no joint pain or joint swelling.  She has no morning stiffness.  She has not had any recent flares.   She is clinically doing well on Cimzia 400 mg sq injections every 28 days, MTX 0.8 ml sq once weekly, and folic acid 2 mg by mouth daily.  She has not missed any doses of her medications recently.  She will continue on this current treatment regimen.  She was advised to notify 03/13/2018 if she develops increased joint pain or joint swelling.  She will follow up in the office in 5 months.  High risk medication use -Current regimen includes Cimzia 400 mg every 28 days in office started on 08/21/18, methotrexate 0.13mls weekly, and folic acid 2 mg daily. She has history of non-complaince and has failed Kevzara, Humira, Enbrel, and Orencia.  Last TB gold negative on 03/11/18.  Most recent CBC/CMP within normal limits except low calcium. Next CBC/CMP due in January and then every 3 months. Standing orders are in place. She has had the yearly influenza vaccination. Last x-rays 05/30/17.  Folliculitis: Bilateral axilla-She has several inflamed follicles in bilateral axilla that are improving. She has not noticed any drainage or fevers.  No palpable lymph nodes.  We dicussed good hygiene habits.  I discussed concerning signs to look out for.  She was advised to follow up with PCP if symptoms worsen.   Trigger finger, right middle finger: Resolved.   History of anemia   Orders:  No orders of the  defined types were placed in this encounter.  Meds ordered this encounter  Medications  . TUBERCULIN SYR 1CC/27GX1/2" 27G X 1/2" 1 ML MISC    Sig: Use 1 weekly to inject Methotrexate    Dispense:  12 each    Refill:  3      Follow-Up Instructions: Return in about 5 months (around 04/13/2019) for Rheumatoid arthritis.   Gearldine Bienenstock, PA-C   I examined and evaluated the patient with Sherron Ales PA.  She is clinically doing much better on Cimzia and methotrexate combination.  She had no synovitis on examination.  She has some folliculitis in the axillary area.  Precautions were discussed.  The plan of care was discussed as noted above.  Pollyann Savoy, MD  Note - This record has been created using Animal nutritionist.  Chart creation errors have been sought, but may not always  have been located. Such creation errors do not reflect on  the standard of medical care.

## 2018-11-05 ENCOUNTER — Other Ambulatory Visit: Payer: Self-pay | Admitting: Rheumatology

## 2018-11-05 DIAGNOSIS — M0579 Rheumatoid arthritis with rheumatoid factor of multiple sites without organ or systems involvement: Secondary | ICD-10-CM

## 2018-11-05 NOTE — Telephone Encounter (Signed)
Last Visit: 07/31/18 Next Visit: 11/12/18 Labs: 09/19/18 Ca is low TB Gold: 03/11/18 Neg   Okay to refill per Dr. Corliss Skains

## 2018-11-06 MED ORDER — CERTOLIZUMAB PEGOL 2 X 200 MG/ML ~~LOC~~ KIT: 400.0000 mg | PACK | SUBCUTANEOUS | 2 refills | Status: DC

## 2018-11-06 NOTE — Telephone Encounter (Signed)
Pharmacy called to verify prescription as it was sent in for the starter kit.  New prescription sent for maintenance dose.

## 2018-11-06 NOTE — Addendum Note (Signed)
Addended by: Verlin Fester C on: 11/06/2018 08:33 AM   Modules accepted: Orders

## 2018-11-07 ENCOUNTER — Other Ambulatory Visit: Payer: Self-pay

## 2018-11-07 ENCOUNTER — Ambulatory Visit: Payer: Medicaid Other | Admitting: Family Medicine

## 2018-11-07 ENCOUNTER — Encounter: Payer: Self-pay | Admitting: Family Medicine

## 2018-11-07 VITALS — BP 110/56 | HR 82 | Temp 98.2°F | Ht 65.0 in | Wt 155.4 lb

## 2018-11-07 DIAGNOSIS — Z0000 Encounter for general adult medical examination without abnormal findings: Secondary | ICD-10-CM

## 2018-11-07 DIAGNOSIS — Z23 Encounter for immunization: Secondary | ICD-10-CM

## 2018-11-07 MED ORDER — NAPROXEN 500 MG PO TABS: ORAL_TABLET | ORAL | 0 refills | Status: DC

## 2018-11-07 NOTE — Patient Instructions (Addendum)
It was great to see you!  Our plans for today:  - Make an appointment for a pap smear. - We refilled your naproxen. Be careful not to take this every day, let your rheumatologist know you are taking this as needed for headache.  Take care and seek immediate care sooner if you develop any concerns.   Dr. [facility]

## 2018-11-07 NOTE — Progress Notes (Signed)
  Subjective:   Patient ID: Hayley Burke    DOB: 1986/11/15, 32 y.o. female   MRN: [id number]  Hayley Burke is a 32 y.o. female with a history of RA, IDA here for   Establish Care - RA - follows with Rheum. On Cimzia, methotrexate. Gets flairs once every 4 mths, takes prednisone for flairs. - IDA - last Hb 12.9 09/2018. Sees hematologist, gets feraheme infusions with Dr. Candise Che. - h/o migraines - takes excedrin about three times per week but notes she gets rebound headaches. Previously took naproxen in [place] with good relief. - only takes prednisone when has a flare - SHx - C section, tonsillectomy  Reproductive History - G1P1 - Menses: light spotting for ~3 days, cramping and headaches - Contraception: Kyleena - Cancer screening: due. Prior +HPV. Had previously gotten 2 HPV vaccines.  - +FH breast cancer, mom was 46yo.  Healthcare Maintenance - Vaccines: flu  - Colonoscopy: N/A - Mammogram: N/A - Pap Smear: due - DEXA Scan: N/A  Review of Systems:  Per HPI.  PMFSH, medications and smoking status reviewed.  Objective:   BP (!) 110/56   Pulse 82   Temp 98.2 F (36.8 C) (Oral)   Ht 5\' 5"  (1.651 m)   Wt 155 lb 6.4 oz (70.5 kg)   SpO2 98%   BMI 25.86 kg/m  Vitals and nursing note reviewed.  General: well nourished, well developed, in no acute distress with non-toxic appearance HEENT: normocephalic, atraumatic, moist mucous membranes CV: regular rate and rhythm without murmurs, rubs, or gallops Lungs: clear to auscultation bilaterally with normal work of breathing Abdomen: soft, non-tender, non-distended, normoactive bowel sounds Skin: warm, dry, no rashes or lesions Extremities: warm and well perfused, normal tone MSK: ROM grossly intact, strength intact, gait normal Neuro: Alert and oriented, speech normal  Assessment & Plan:   Establish Care History, medications reviewed and updated in EMR. Flu vaccine given today. Instructed to make f/u appointment for pap smear. Refill  provided for naproxen, instructed to use sparingly given h/o RA on methotrexate. F/u with hematology and rheumatology as previously scheduled.  Orders Placed This Encounter  Procedures  . Flu Vaccine QUAD 36+ mos IM   Meds ordered this encounter  Medications  . naproxen (NAPROSYN) 500 MG tablet    Sig: Take 1 tablet twice a day as needed with food for pain    Dispense:  60 tablet    Refill:  0    , DO PGY-2, [facility] 11/07/2018 10:11 PM

## 2018-11-12 ENCOUNTER — Ambulatory Visit (INDEPENDENT_AMBULATORY_CARE_PROVIDER_SITE_OTHER): Payer: Medicaid Other | Admitting: Rheumatology

## 2018-11-12 ENCOUNTER — Encounter: Payer: Self-pay | Admitting: Rheumatology

## 2018-11-12 VITALS — BP 111/67 | HR 86 | Resp 15 | Ht 65.0 in | Wt 156.8 lb

## 2018-11-12 DIAGNOSIS — M0579 Rheumatoid arthritis with rheumatoid factor of multiple sites without organ or systems involvement: Secondary | ICD-10-CM

## 2018-11-12 DIAGNOSIS — Z862 Personal history of diseases of the blood and blood-forming organs and certain disorders involving the immune mechanism: Secondary | ICD-10-CM

## 2018-11-12 DIAGNOSIS — Z79899 Other long term (current) drug therapy: Secondary | ICD-10-CM

## 2018-11-12 DIAGNOSIS — M65331 Trigger finger, right middle finger: Secondary | ICD-10-CM | POA: Diagnosis not present

## 2018-11-12 DIAGNOSIS — L739 Follicular disorder, unspecified: Secondary | ICD-10-CM

## 2018-11-12 MED ORDER — "TUBERCULIN SYRINGE 27G X 1/2"" 1 ML MISC": 3 refills | Status: DC

## 2018-11-12 NOTE — Patient Instructions (Signed)
Standing Labs We placed an order today for your standing lab work.    Please come back and get your standing labs in January and every 3 months   We have open lab Monday through Friday from 8:30-11:30 AM and 1:30-4:00 PM  at the office of Dr. Shaili Deveshwar.   You may experience shorter wait times on Monday and Friday afternoons. The office is located at [address], [address], [place], [place] [address] No appointment is necessary.   Labs are drawn by [facility].  You may receive a bill from [facility] for your lab work. If you have any questions regarding directions or hours of operation,  please call [phone].   Just as a reminder please drink plenty of water prior to coming for your lab work. Thanks!   

## 2018-11-15 MED FILL — CIMZIA 200 MG/ML SYRN KIT: 2 X 200 | 28 days supply | Qty: 2 | Fill #0

## 2018-11-19 ENCOUNTER — Ambulatory Visit: Payer: Medicaid Other

## 2018-11-19 ENCOUNTER — Telehealth: Payer: Self-pay | Admitting: Rheumatology

## 2018-11-19 NOTE — Telephone Encounter (Signed)
Patient states her daughter has pneumonia and a double ear infection. Patient has an upper respiratory infection. Patient states she has not been evaluated by PCP. Patient advised she should contact her PCP for evaluation. Patient advised to contact the office to reschedule appointment for Cimzia once she os feeling better. Patient verbalized understanding.

## 2018-11-19 NOTE — Telephone Encounter (Signed)
Patient called to cancel nurse visit for today. She has a upper respiratory infection. Please call to reschedule appt for Cimzia.

## 2018-11-20 ENCOUNTER — Ambulatory Visit: Payer: Medicaid Other

## 2018-11-26 ENCOUNTER — Telehealth: Payer: Self-pay | Admitting: Rheumatology

## 2018-11-26 ENCOUNTER — Encounter: Payer: Medicaid Other | Admitting: Family Medicine

## 2018-11-26 NOTE — Telephone Encounter (Signed)
Patient left a voicemail stating she is feeling better and checking if it is okay to schedule her nurse visit for Cimzia. Patient requested a return call.

## 2018-11-26 NOTE — Telephone Encounter (Signed)
Patient states she is feeling better and is not on any medications. Patient has been scheduled for her Cimzia injection on 11/27/18 at 3:00 pm.

## 2018-11-27 ENCOUNTER — Ambulatory Visit: Payer: Medicaid Other | Admitting: *Deleted

## 2018-11-27 VITALS — BP 123/63 | HR 86

## 2018-11-27 DIAGNOSIS — M0579 Rheumatoid arthritis with rheumatoid factor of multiple sites without organ or systems involvement: Secondary | ICD-10-CM | POA: Diagnosis not present

## 2018-11-27 MED ORDER — CERTOLIZUMAB PEGOL 2 X 200 MG/ML ~~LOC~~ KIT
400.0000 mg | PACK | Freq: Once | SUBCUTANEOUS | Status: AC
  Administered 2018-11-27: 400 mg via SUBCUTANEOUS

## 2018-11-27 NOTE — Progress Notes (Signed)
Patient in office for a Cimzia injection. Patient denies any infections, fever or use of antibiotic use. Patient given injections and tolerated well. Patient will return in 1 month for next injections. Patient will be due for labs with her next visit.   Administrations This Visit    Certolizumab Pegol KIT 400 mg    Admin Date 11/27/2018 Action Given Dose 400 mg Route Subcutaneous Administered By Carole Binning, LPN

## 2018-12-01 NOTE — Progress Notes (Deleted)
  Subjective:   Patient ID: Hayley Burke    DOB: [birth year], 33 y.o. female   MRN: [id number]  Hayley Burke is a 32 y.o. female with a history of RA, IDA here for   Pap smear - ***  Review of Systems:  Per HPI.  PMFSH, medications and smoking status reviewed.  Objective:   There were no vitals taken for this visit. Vitals and nursing note reviewed.  General: well nourished, well developed, in no acute distress with non-toxic appearance HEENT: normocephalic, atraumatic, moist mucous membranes Neck: supple, non-tender without lymphadenopathy CV: regular rate and rhythm without murmurs, rubs, or gallops, no lower extremity edema Lungs: clear to auscultation bilaterally with normal work of breathing Abdomen: soft, non-tender, non-distended, no masses or organomegaly palpable, normoactive bowel sounds Skin: warm, dry, no rashes or lesions Extremities: warm and well perfused, normal tone MSK: ROM grossly intact, strength intact, gait normal Neuro: Alert and oriented, speech normal  Assessment & Plan:   No problem-specific Assessment & Plan notes found for this encounter.  No orders of the defined types were placed in this encounter.  No orders of the defined types were placed in this encounter.   Ellwood Dense, DO PGY-2, [facility] 12/01/2018 9:24 PM

## 2018-12-02 ENCOUNTER — Encounter: Payer: Medicaid Other | Admitting: Family Medicine

## 2018-12-03 ENCOUNTER — Telehealth: Payer: Self-pay | Admitting: Rheumatology

## 2018-12-03 MED ORDER — METHOTREXATE SODIUM CHEMO INJECTION 250 MG/10ML: INTRAMUSCULAR | 0 refills | Status: DC

## 2018-12-03 NOTE — Telephone Encounter (Signed)
Patient called requesting prescription refill of Methotrexate to be sent to [facility] on [address].  Patient states she took her injection yesterday, but didn't have enough medicine.  Patient is requesting the prescription be sent today if possible so she can take the remaining dose.

## 2018-12-03 NOTE — Telephone Encounter (Signed)
Last Visit: 11/12/18 Next Visit: 12/25/18 Labs: 09/19/18 Ca is low.  Okay to refill per Dr. Corliss Skains

## 2018-12-04 NOTE — Telephone Encounter (Signed)
Left message to advise patient prescription has been sent to the pharmacy.  

## 2018-12-09 NOTE — Progress Notes (Signed)
HEMATOLOGY/ONCOLOGY CLINIC NOTE  Date of Service: 12/10/18    Patient Care Team: Hayley Percy, DO as PCP - General (Family Medicine)  CHIEF COMPLAINTS/PURPOSE OF CONSULTATION:  Anemia  HISTORY OF PRESENTING ILLNESS:   Hayley Burke is a wonderful 32 y.o. female who has been referred to [place] by Hayley Burke N.P for evaluation and management of anemia. She is accompanied today by her mother and daughter. The pt reports that she is doing well overall.   The pt reports that she sees Dr. Estanislado Burke for her RA. She notes that she has taken Metholtrexate 0.8 mL for 6 years.  She was first diagnosed with RA 6 years ago, she has also tried Plaquinil,  Humara, and Enbrel in the past. She notes that she has been on biologics since being diagnosed. She takes 71m Folic acid replacement daily. She notes that she receives prednisone during her RA flare ups.   She notes that she first learned of her anemia a year ago. She noticed that her nails were getting more brittle, and that she was more tired.  She notes that she took Ferrous sulfate in the past, during her pregnancy 4 years ago.  She notes that she has ice cravings. She notes that she has not had a period since beginning [facility] [place] January. She notes that she has had normal to slightly heavy before this. She has never had IV iron nor a blood transfusion.   She notes that she takes 2-4 tablets of Ibuprofen daily for 6-7 years for chronic headaches and migraines. She denies any stomach issues and does not take OTC anti acids. She notes that she has no drug allergies besides Sulfa antibiotics which was accompanied by hives.   Most recent lab results (02/22/18) of CBC  is as follows: all values are WNL except for Hgb at 11.0, RDW at 17.6, EOS Abs aty 1.1k. Ferritin 02/22/18 was low at 13. Vitamin B12 02/22/18 was slightly elevated at 1318.  Iron and TIBC 02/22/18 showed Iron low at 15 and Iron Sat low at 5%  On review of systems, pt reports being  tired, occasional mouth sores, and denies blood in the stools, melena, any bleeding, sudden weight loss, fevers, chills, night sweats, noticing any enlarged lymph nodes, abdominal pains, leg swelling and any other symptoms.   On PMHx the pt reports Rheumatoid Arthritis, and anemia. On Surgical Hx the pt reports adenoid surgery, caesarean section with a post-operative hematoma, and tonsilectomy.  On Social Hx the pt denies much ETOH use and quit smoking 5 years ago.  On Family Hx the pt reports her father had lung cancer, her cousins had prostate cancer.  Interval History:   SLegna Mausolfreturns today regarding her iron deficiency anemia. The patient's last visit with [place] on 06/10/18. She is accompanied today by her husband and her daughter. The pt reports that she is doing well overall.   The pt reports that she has begun CFrench Guianawith her Rheumatologist in the interim and notes that she is also taking 50k units Vitamin D once a week. She notes that CFrench [place]as greatly improved her RA symptoms and denies any current red/swollen/painful joints at this time.    The pt is enjoying good energy levels, is eating well and remaining active.  She notes that she continues to tolerate her IUD well and notes her periods "come a little bit, if at all."   Lab results today (12/10/18) of CBC w/diff and CMP is as follows: all  values are WNL except for WBC at 10.6k, Calcium at 8.4, Total Protein at 5.6, Albumin at 3.1, Total Bilirubin at <0.2 . 12/10/18 Ferritin is pending 12/10/18 Iron and TIBC is pending  On review of systems, pt reports well controlled RA, good energy levels, stable weight, and denies periods, concerns for blood loss, light headedness, dizziness, and any other symptoms.   MEDICAL HISTORY:  Past Medical History:  Diagnosis Date  . Rheumatoid arthritis ([place])     SURGICAL HISTORY: Past Surgical History:  Procedure Laterality Date  . CESAREAN SECTION    . TONSILLECTOMY AND ADENOIDECTOMY       SOCIAL HISTORY: Social History   Socioeconomic History  . Marital status: Unknown    Spouse name: Not on file  . Number of children: Not on file  . Years of education: Not on file  . Highest education level: Not on file  Occupational History  . Not on file  Social Needs  . Financial resource strain: Not on file  . Food insecurity:    Worry: Not on file    Inability: Not on file  . Transportation needs:    Medical: Not on file    Non-medical: Not on file  Tobacco Use  . Smoking status: Former Smoker    Packs/day: 1.00    Years: 15.00    Pack years: 15.00    Types: Cigarettes    Last attempt to quit: 06/26/2012    Years since quitting: 6.4  . Smokeless tobacco: Never Used  Substance and Sexual Activity  . Alcohol use: No  . Drug use: No  . Sexual activity: Yes  Lifestyle  . Physical activity:    Days per week: Not on file    Minutes per session: Not on file  . Stress: Not on file  Relationships  . Social connections:    Talks on phone: Not on file    Gets together: Not on file    Attends religious service: Not on file    Active member of club or organization: Not on file    Attends meetings of clubs or organizations: Not on file    Relationship status: Not on file  . Intimate partner violence:    Fear of current or ex partner: Not on file    Emotionally abused: Not on file    Physically abused: Not on file    Forced sexual activity: Not on file  Other Topics Concern  . Not on file  Social History Narrative  . Not on file    FAMILY HISTORY: Family History  Problem Relation Age of Onset  . Sjogren's syndrome Mother   . Breast cancer Mother   . Depression Mother   . Lung cancer Father   . Hyperlipidemia Father   . Drug abuse Brother   . Heart attack Paternal Grandfather     ALLERGIES:  is allergic to sulfa antibiotics.  MEDICATIONS:  Current Outpatient Medications  Medication Sig Dispense Refill  . aspirin-acetaminophen-caffeine (EXCEDRIN  MIGRAINE) 250-250-65 MG tablet Take 1 tablet by mouth every 6 (six) hours as needed for headache.    . Biotin 10000 MCG TABS Take by mouth.    . Calcium Carbonate-Vit D-Min (CALCIUM 1200 PO) Take by mouth.    . Certolizumab Pegol 2 X 200 MG/ML KIT Inject 400 mg into the skin every 28 (twenty-eight) days. 1 kit 2  . Cyanocobalamin (VITAMIN B-12 PO) Take by mouth daily.    . cycloSPORINE (RESTASIS) 0.05 % ophthalmic emulsion as needed.    Marland Kitchen  folic acid (FOLVITE) 1 MG tablet Take 2 tablets (2 mg total) by mouth daily. 60 tablet 3  . Levonorgestrel (KYLEENA) 19.5 MG IUD Kyleena 17.5 mcg/24 hour (5 years) intrauterine device  TO BE INSERTED ONE TIME BY PRESCRIBER. ROUTE INTRAUTERINE.    . methotrexate 250 MG/10ML injection Inject 0.8 mLs (20 mg total) into the skin once a week. 10 mL 0  . naproxen (NAPROSYN) 500 MG tablet Take 1 tablet twice a day as needed with food for pain 60 tablet 0  . TUBERCULIN SYR 1CC/27GX1/2" 27G X 1/2" 1 ML MISC Use 1 weekly to inject Methotrexate 12 each 3  . TURMERIC PO Take by mouth daily.     No current facility-administered medications for this visit.     REVIEW OF SYSTEMS:    A 10+ POINT REVIEW OF SYSTEMS WAS OBTAINED including neurology, dermatology, psychiatry, cardiac, respiratory, lymph, extremities, GI, GU, Musculoskeletal, constitutional, breasts, reproductive, HEENT.  All pertinent positives are noted in the HPI.  All others are negative.   PHYSICAL EXAMINATION:  . Vitals:   12/10/18 1210  BP: 108/65  Pulse: 78  Resp: 18  Temp: 98.2 F (36.8 C)  SpO2: 100%   Filed Weights   12/10/18 1210  Weight: 156 lb 12.8 oz (71.1 kg)   .Body mass index is 26.09 kg/m.  GENERAL:alert, in no acute distress and comfortable SKIN: no acute rashes, no significant lesions EYES: conjunctiva are pink and non-injected, sclera anicteric OROPHARYNX: MMM, no exudates, no oropharyngeal erythema or ulceration NECK: supple, no JVD LYMPH:  no palpable lymphadenopathy  in the cervical, axillary or inguinal regions LUNGS: clear to auscultation b/l with normal respiratory effort HEART: regular rate & rhythm ABDOMEN:  normoactive bowel sounds , non tender, not distended. No palpable hepatosplenomegaly.  Extremity: no pedal edema PSYCH: alert & oriented x 3 with fluent speech NEURO: no focal motor/sensory deficits   LABORATORY DATA:  I have reviewed the data as listed  . CBC Latest Ref Rng & Units 12/10/2018 09/19/2018 07/01/2018  WBC 4.0 - 10.5 K/uL 10.6(H) 5.0 4.8  Hemoglobin 12.0 - 15.0 g/dL 13.4 12.9 12.1  Hematocrit 36.0 - 46.0 % 41.1 38.8 35.4  Platelets 150 - 400 K/uL 278 246 271  HGB 10.6 ANC 6.1k  . CMP Latest Ref Rng & Units 12/10/2018 09/19/2018 07/01/2018  Glucose 70 - 99 mg/dL 76 84 107(H)  BUN 6 - 20 mg/dL _0 Creatinine 0.44 - 1.00 mg/dL 0.77 0.85 0.83  Sodium 135 - 145 mmol/L 142 140 142  Potassium 3.5 - 5.1 mmol/L 4.1 3.9 4.4  Chloride 98 - 111 mmol/L 108 107 109  CO2 22 - 32 mmol/L _1 Calcium 8.9 - 10.3 mg/dL 8.4(L) 8.4(L) 8.7  Total Protein 6.5 - 8.1 g/dL 5.6(L) 5.5(L) 5.7(L)  Total Bilirubin 0.3 - 1.2 mg/dL <0.2(L) 0.3 0.3  Alkaline Phos 38 - 126 U/L 47 - -  AST 15 - 41 U/L _2 ALT 0 - 44 U/L _3 Component     Latest Ref Rng & Units 03/20/2018  Iron     41 - 142 ug/dL 55  TIBC     236 - 444 ug/dL 361  Saturation Ratios     21 - 57 % 15 (L)  UIBC     ug/dL 307  Sed Rate     0 - 22 mm/hr 5  Vitamin D, 25-Hydroxy     30.0 - 100.0 ng/mL 26.0 (L)  Intrinsic Factor     0.0 - 1.1 AU/mL 1.0  Parietal Cell Antibody-IgG     0.0 - 20.0 Units 28.0 (H)  Vitamin B12     180 - 914 pg/mL 1,021 (H)  Ferritin     9 - 269 ng/mL 10    . Lab Results  Component Value Date   IRON 58 12/10/2018   TIBC 236 12/10/2018   IRONPCTSAT 24 12/10/2018   (Iron and TIBC)  Lab Results  Component Value Date   FERRITIN 49 12/10/2018    RADIOGRAPHIC STUDIES: I have personally reviewed the radiological images  as listed and agreed with the findings in the report. No results found.  ASSESSMENT & PLAN:  32 y.o. female with  1. Iron Deficiency Anemia -Discussed that her Hgb was around 14 as of 2 years ago, but had dropped to 9.5 in 09/2016. Ferritin was at 13 on 02/22/18.  Ferritin at 49  2. Newly diagnosed Pernicious anemia -  elevated parietal cell antibody -Antiparietal cell antibody persence suggesting pernicious anemia which could limit iron absorption po.  3. h/o B12 deficiency . 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 Could be a cause of the Iron and B12 deficiency B12 currentl WNL @ 1021  PLAN -Discussed pt labwork today, 12/10/18; HGB normal at 13.4 -12/10/18 Ferritin is 49 -Pt is clinically much improved. Taking Vitamin B12 regularly. RA well controlled with Cemzia. Blood loss from periods improved with IUD tolerance. Previous frequent NSAID use now discontinued and actively avoided.  -With RA present, would prefer ferritin to be around 100 -Recommend taking Vitamin B complex given antiparietal cell antibody -Continue eating iron rich foods -Slow-Fe OTC-continue -Will continue to trend out Ferritin in 6 months again, and will then consider once a year visits    -Will see the pt back in 6 months    RTC with dr  with labs in 6 months   All of the patients questions were answered with apparent satisfaction. The patient knows to call the clinic with any problems, questions or concerns.  The total time spent in the appt was 20 minutes and more than 50% was on counseling and direct patient cares.      MD MS AAHIVMS SCH CTH Hematology/Oncology Physician [facility]  (Office):       [phone] (Work cell):  [phone] (Fax):           [phone]  12/10/2018 12:37 PM  I, Schuyler Bain, am acting as a scribe for Dr.  .   .I have reviewed the above documentation for accuracy and completeness, and I agree with the  above. . Kishore  MD   

## 2018-12-10 ENCOUNTER — Telehealth: Payer: Self-pay

## 2018-12-10 ENCOUNTER — Inpatient Hospital Stay: Payer: Medicaid Other | Attending: Hematology

## 2018-12-10 ENCOUNTER — Inpatient Hospital Stay (HOSPITAL_BASED_OUTPATIENT_CLINIC_OR_DEPARTMENT_OTHER): Payer: Medicaid Other | Admitting: Hematology

## 2018-12-10 VITALS — BP 108/65 | HR 78 | Temp 98.2°F | Resp 18 | Ht 65.0 in | Wt 156.8 lb

## 2018-12-10 DIAGNOSIS — M069 Rheumatoid arthritis, unspecified: Secondary | ICD-10-CM | POA: Diagnosis not present

## 2018-12-10 DIAGNOSIS — D509 Iron deficiency anemia, unspecified: Secondary | ICD-10-CM | POA: Insufficient documentation

## 2018-12-10 DIAGNOSIS — Z87891 Personal history of nicotine dependence: Secondary | ICD-10-CM

## 2018-12-10 DIAGNOSIS — D510 Vitamin B12 deficiency anemia due to intrinsic factor deficiency: Secondary | ICD-10-CM

## 2018-12-10 DIAGNOSIS — Z7982 Long term (current) use of aspirin: Secondary | ICD-10-CM | POA: Insufficient documentation

## 2018-12-10 DIAGNOSIS — Z79899 Other long term (current) drug therapy: Secondary | ICD-10-CM

## 2018-12-10 DIAGNOSIS — D508 Other iron deficiency anemias: Secondary | ICD-10-CM

## 2018-12-10 LAB — CBC WITH DIFFERENTIAL/PLATELET
ABS IMMATURE GRANULOCYTES: 0.03 10*3/uL (ref 0.00–0.07)
Basophils Absolute: 0.1 10*3/uL (ref 0.0–0.1)
Basophils Relative: 1 %
Eosinophils Absolute: 4.3 10*3/uL — ABNORMAL HIGH (ref 0.0–0.5)
Eosinophils Relative: 41 %
HEMATOCRIT: 41.1 % (ref 36.0–46.0)
Hemoglobin: 13.4 g/dL (ref 12.0–15.0)
Immature Granulocytes: 0 %
LYMPHS ABS: 1.9 10*3/uL (ref 0.7–4.0)
Lymphocytes Relative: 18 %
MCH: 31.5 pg (ref 26.0–34.0)
MCHC: 32.6 g/dL (ref 30.0–36.0)
MCV: 96.7 fL (ref 80.0–100.0)
MONOS PCT: 4 %
Monocytes Absolute: 0.5 10*3/uL (ref 0.1–1.0)
NEUTROS ABS: 3.9 10*3/uL (ref 1.7–7.7)
Neutrophils Relative %: 36 %
Platelets: 278 10*3/uL (ref 150–400)
RBC: 4.25 MIL/uL (ref 3.87–5.11)
RDW: 13.8 % (ref 11.5–15.5)
WBC: 10.6 10*3/uL — ABNORMAL HIGH (ref 4.0–10.5)
nRBC: 0 % (ref 0.0–0.2)

## 2018-12-10 LAB — CMP (CANCER CENTER ONLY)
ALT: 20 U/L (ref 0–44)
ANION GAP: 8 (ref 5–15)
AST: 16 U/L (ref 15–41)
Albumin: 3.1 g/dL — ABNORMAL LOW (ref 3.5–5.0)
Alkaline Phosphatase: 47 U/L (ref 38–126)
BUN: 13 mg/dL (ref 6–20)
CO2: 26 mmol/L (ref 22–32)
Calcium: 8.4 mg/dL — ABNORMAL LOW (ref 8.9–10.3)
Chloride: 108 mmol/L (ref 98–111)
Creatinine: 0.77 mg/dL (ref 0.44–1.00)
GFR, Est AFR Am: >60 mL/min (ref >60)
GFR, Estimated: >60 mL/min (ref >60)
GLUCOSE: 76 mg/dL (ref 70–99)
POTASSIUM: 4.1 mmol/L (ref 3.5–5.1)
SODIUM: 142 mmol/L (ref 135–145)
Total Bilirubin: <0.2 mg/dL — ABNORMAL LOW (ref 0.3–1.2)
Total Protein: 5.6 g/dL — ABNORMAL LOW (ref 6.5–8.1)

## 2018-12-10 LAB — FERRITIN: Ferritin: 49 ng/mL (ref 11–307)

## 2018-12-10 LAB — IRON AND TIBC
Iron: 58 ug/dL (ref 41–142)
SATURATION RATIOS: 24 % (ref 21–57)
TIBC: 236 ug/dL (ref 236–444)
UIBC: 179 ug/dL (ref 120–384)

## 2018-12-10 NOTE — Telephone Encounter (Signed)
Printed avs and calender of upcoming appointment. Per 12/24 los 

## 2018-12-10 NOTE — Patient Instructions (Signed)
Thank you for choosing [facility] to provide your oncology and hematology care.  To afford each patient quality time with our providers, please arrive 30 minutes before your scheduled appointment time.  If you arrive late for your appointment, you may be asked to reschedule.  We strive to give you quality time with our providers, and arriving late affects you and other patients whose appointments are after yours.    If you are a no show for multiple scheduled visits, you may be dismissed from the clinic at the providers discretion.     Again, thank you for choosing [facility], our hope is that these requests will decrease the amount of time that you wait before being seen by our physicians.  ______________________________________________________________________   Should you have questions after your visit to the [facility], please contact our office at [phone] between the hours of 8:30 and 4:30 p.m.    Voicemails left after 4:30p.m will not be returned until the following business day.     For prescription refill requests, please have your pharmacy contact us directly.  Please also try to allow 48 hours for prescription requests.     Please contact the scheduling department for questions regarding scheduling.  For scheduling of procedures such as PET scans, CT scans, MRI, Ultrasound, etc please contact central scheduling at [phone].     Resources For Cancer Patients and Caregivers:    Oncolink.org:  A wonderful resource for patients and healthcare providers for information regarding your disease, ways to tract your treatment, what to expect, etc.      American Cancer Society:  800-227-2345  Can help patients locate various types of support and financial assistance   Cancer Care: 1-800-813-HOPE (4673) Provides financial assistance, online support groups, medication/co-pay assistance.     [place] DSS:  336-641-3447 Where to apply  for food stamps, Medicaid, and utility assistance   Medicare Rights Center: 800-333-4114 Helps people with Medicare understand their rights and benefits, navigate the Medicare system, and secure the quality healthcare they deserve   SCAT: 336-333-6589 [place] [employer]'s shared-ride transportation service for eligible riders who have a disability that prevents them from riding the fixed route bus.     For additional information on assistance programs please contact our social worker:   Abigail Elmore:  [phone]  

## 2018-12-23 MED FILL — CIMZIA 200 MG/ML SYRN KIT: 2 X 200 | 28 days supply | Qty: 2 | Fill #1

## 2018-12-24 NOTE — Telephone Encounter (Signed)
Patient's Cimzia has been received from the pharmacy and placed in the fridge. Patient's nurse visit scheduled for 12/25/18.

## 2018-12-25 ENCOUNTER — Ambulatory Visit (INDEPENDENT_AMBULATORY_CARE_PROVIDER_SITE_OTHER): Payer: Medicaid Other | Admitting: *Deleted

## 2018-12-25 VITALS — BP 115/76 | HR 83

## 2018-12-25 DIAGNOSIS — M0579 Rheumatoid arthritis with rheumatoid factor of multiple sites without organ or systems involvement: Secondary | ICD-10-CM

## 2018-12-25 MED ORDER — CERTOLIZUMAB PEGOL 2 X 200 MG/ML ~~LOC~~ KIT
400.0000 mg | PACK | Freq: Once | SUBCUTANEOUS | Status: AC
  Administered 2018-12-25: 400 mg via SUBCUTANEOUS

## 2018-12-25 NOTE — Progress Notes (Signed)
Patient in office for a Cimzia injection. Patient denies any fever, infection or use of antibiotics. Patient was given injections in right and left lower abdomen. Patient tolerated injections well and will return in 1 month for next injections.   Administrations This Visit    Certolizumab Pegol KIT 400 mg    Admin Date 12/25/2018 Action Given Dose 400 mg Route Subcutaneous Administered By Carole Binning, LPN

## 2019-01-22 ENCOUNTER — Ambulatory Visit: Payer: Self-pay

## 2019-01-22 ENCOUNTER — Telehealth: Payer: Self-pay | Admitting: *Deleted

## 2019-01-22 MED FILL — CIMZIA 200 MG/ML SYRN KIT: 2 X 200 | 28 days supply | Qty: 1 | Fill #2

## 2019-01-22 NOTE — Telephone Encounter (Signed)
Attempted to contact the patient and left message for patient to call the office.  

## 2019-01-24 ENCOUNTER — Ambulatory Visit: Payer: Self-pay

## 2019-02-04 ENCOUNTER — Telehealth: Payer: Self-pay | Admitting: Rheumatology

## 2019-02-04 NOTE — Telephone Encounter (Signed)
Patient called to reschedule her appointment with Sue Lush for Cimzia injection.  Patient states she is available 2/19 after 10:00, 2/20 before 3:00, 2/21 after 10:00.

## 2019-02-04 NOTE — Telephone Encounter (Signed)
Patient has been scheduled for 02/07/2019 at 10:30am to receive Cimzia injection.  Patient is finished with antibiotics and symptoms have resolved.

## 2019-02-07 ENCOUNTER — Ambulatory Visit (INDEPENDENT_AMBULATORY_CARE_PROVIDER_SITE_OTHER): Payer: Medicaid Other | Admitting: *Deleted

## 2019-02-07 VITALS — BP 111/82 | HR 91

## 2019-02-07 DIAGNOSIS — M0579 Rheumatoid arthritis with rheumatoid factor of multiple sites without organ or systems involvement: Secondary | ICD-10-CM | POA: Diagnosis not present

## 2019-02-07 MED ORDER — CERTOLIZUMAB PEGOL 2 X 200 MG/ML ~~LOC~~ KIT
400.0000 mg | PACK | Freq: Once | SUBCUTANEOUS | Status: AC
  Administered 2019-02-07: 400 mg via SUBCUTANEOUS

## 2019-02-07 NOTE — Progress Notes (Signed)
Patient in office for a Cimzia injection. Patient denies any fever use of antibiotics or fever.  Patient was given Cimzia in right and left lower abdomen. Patient tolerated injection well. Patient to return in one month for next injection. Patient will be due for CBC, CMP and TB Gold with next injection.   Administrations This Visit    Certolizumab Pegol KIT 400 mg    Admin Date 02/07/2019 Action Given Dose 400 mg Route Subcutaneous Administered By Carole Binning, LPN

## 2019-02-17 ENCOUNTER — Other Ambulatory Visit: Payer: Self-pay | Admitting: Rheumatology

## 2019-02-17 DIAGNOSIS — M0579 Rheumatoid arthritis with rheumatoid factor of multiple sites without organ or systems involvement: Secondary | ICD-10-CM

## 2019-02-17 NOTE — Telephone Encounter (Signed)
Last Visit: 11/12/18 Next Visit: 04/15/19 Labs: 12/10/18 WBC 10.6, Eosinophils 4.3, Calcium 8.4 Total protein 5.6 Albumin 3.1 TB Gold: 03/11/18 Neg   Okay to refill per Dr. Corliss Skains

## 2019-02-18 ENCOUNTER — Telehealth: Payer: Self-pay | Admitting: Rheumatology

## 2019-02-18 MED ORDER — METHOTREXATE SODIUM CHEMO INJECTION 50 MG/2ML: 20.0000 mg | INTRAMUSCULAR | 0 refills | Status: DC

## 2019-02-18 NOTE — Telephone Encounter (Signed)
Patient called requesting prescription refill of Methotrexate to be sent to [facility] on [address]  Patient states she is due to take her injection today.

## 2019-02-18 NOTE — Telephone Encounter (Signed)
Last Visit: 11/12/18 Next Visit: 04/15/19 Labs: 12/10/18 WBC 10.6, Eosinophils 4.3, Calcium 8.4 Total protein 5.6 Albumin 3.1  Okay to refill per Dr. Corliss Skains

## 2019-03-03 MED FILL — CIMZIA 200 MG/ML SYRN KIT: 2 X 200 | 28 days supply | Qty: 1 | Fill #0

## 2019-03-05 ENCOUNTER — Telehealth: Payer: Self-pay | Admitting: Pharmacy Technician

## 2019-03-05 ENCOUNTER — Ambulatory Visit: Payer: Medicaid Other | Admitting: Family Medicine

## 2019-03-05 ENCOUNTER — Telehealth: Payer: Self-pay | Admitting: Rheumatology

## 2019-03-05 NOTE — Telephone Encounter (Signed)
Patient called checking on appointment date and time.

## 2019-03-05 NOTE — Telephone Encounter (Signed)
Patient's appointment is scheduled for 03/07/19.

## 2019-03-05 NOTE — Telephone Encounter (Signed)
Patient's Cimzia has been received from [facility], medication was placed in the fridge.

## 2019-03-07 ENCOUNTER — Other Ambulatory Visit: Payer: Self-pay

## 2019-03-07 ENCOUNTER — Ambulatory Visit (INDEPENDENT_AMBULATORY_CARE_PROVIDER_SITE_OTHER): Payer: Medicaid Other | Admitting: *Deleted

## 2019-03-07 VITALS — BP 110/77 | HR 89

## 2019-03-07 DIAGNOSIS — M0579 Rheumatoid arthritis with rheumatoid factor of multiple sites without organ or systems involvement: Secondary | ICD-10-CM

## 2019-03-07 MED ORDER — CERTOLIZUMAB PEGOL 2 X 200 MG/ML ~~LOC~~ KIT: 400.0000 mg | PACK | Freq: Once | SUBCUTANEOUS | Status: DC

## 2019-03-07 MED ORDER — CERTOLIZUMAB PEGOL 2 X 200 MG ~~LOC~~ KIT
400.0000 mg | PACK | Freq: Once | SUBCUTANEOUS | Status: AC
  Administered 2019-03-07: 400 mg via SUBCUTANEOUS

## 2019-03-07 NOTE — Progress Notes (Signed)
Patient in office for Cimzia injection. Patient was given injections in right and left lower abdomen. Patient tolerated injections well. Patient will return 04/04/19 for next injection.   Administrations This Visit    Certolizumab Pegol KIT 400 mg    Admin Date 03/07/2019 Action Given Dose 400 mg Route Subcutaneous Administered By Carole Binning, LPN

## 2019-03-25 ENCOUNTER — Ambulatory Visit: Payer: Medicaid Other | Admitting: Family Medicine

## 2019-03-27 ENCOUNTER — Telehealth: Payer: Self-pay | Admitting: *Deleted

## 2019-03-27 MED FILL — CIMZIA 200 MG/ML SYRN KIT: 2 X 200 | 28 days supply | Qty: 1 | Fill #1

## 2019-03-27 NOTE — Telephone Encounter (Signed)
Spoke with patient regarding her appointment for 04/04/19. Patient would like to keep her appointment scheduled for 04/04/19 for her Cimzia injection. Patient will contact pharmacy for refill.

## 2019-04-04 ENCOUNTER — Ambulatory Visit (INDEPENDENT_AMBULATORY_CARE_PROVIDER_SITE_OTHER): Payer: Medicaid Other | Admitting: *Deleted

## 2019-04-04 ENCOUNTER — Other Ambulatory Visit: Payer: Self-pay | Admitting: *Deleted

## 2019-04-04 VITALS — BP 120/76 | HR 98 | Temp 97.8°F

## 2019-04-04 DIAGNOSIS — Z9225 Personal history of immunosupression therapy: Secondary | ICD-10-CM

## 2019-04-04 DIAGNOSIS — Z79899 Other long term (current) drug therapy: Secondary | ICD-10-CM

## 2019-04-04 DIAGNOSIS — M0579 Rheumatoid arthritis with rheumatoid factor of multiple sites without organ or systems involvement: Secondary | ICD-10-CM | POA: Diagnosis not present

## 2019-04-04 MED ORDER — CERTOLIZUMAB PEGOL 2 X 200 MG/ML ~~LOC~~ KIT
400.0000 mg | PACK | Freq: Once | SUBCUTANEOUS | Status: AC
  Administered 2019-04-04: 400 mg via SUBCUTANEOUS

## 2019-04-04 NOTE — Progress Notes (Signed)
Patient in office for a Cimzia injection. Patient denies fever, infection or use of antibiotics. Patient is due for labs. Patient is going to [facility] today to have labs done. Patient given injection in right and left lower abdomen. Patient tolerated injections well. Patient will return in 1 month for next injection.    Administrations This Visit    Certolizumab Pegol KIT 400 mg    Admin Date 04/04/2019 Action Given Dose 400 mg Route Subcutaneous Administered By Carole Binning, LPN

## 2019-04-06 LAB — CBC WITH DIFFERENTIAL/PLATELET
Absolute Monocytes: 403 cells/uL (ref 200–950)
Basophils Absolute: 43 cells/uL (ref 0–200)
Basophils Relative: 0.7 %
Eosinophils Absolute: 1373 cells/uL — ABNORMAL HIGH (ref 15–500)
Eosinophils Relative: 22.5 %
HCT: 38.4 % (ref 35.0–45.0)
Hemoglobin: 13 g/dL (ref 11.7–15.5)
Lymphs Abs: 1427 cells/uL (ref 850–3900)
MCH: 32.3 pg (ref 27.0–33.0)
MCHC: 33.9 g/dL (ref 32.0–36.0)
MCV: 95.3 fL (ref 80.0–100.0)
MPV: 11.7 fL (ref 7.5–12.5)
Monocytes Relative: 6.6 %
Neutro Abs: 2855 cells/uL (ref 1500–7800)
Neutrophils Relative %: 46.8 %
Platelets: 268 10*3/uL (ref 140–400)
RBC: 4.03 10*6/uL (ref 3.80–5.10)
RDW: 12.6 % (ref 11.0–15.0)
Total Lymphocyte: 23.4 %
WBC: 6.1 10*3/uL (ref 3.8–10.8)

## 2019-04-06 LAB — COMPLETE METABOLIC PANEL WITH GFR
AG Ratio: 1.9 (calc) (ref 1.0–2.5)
ALT: 12 U/L (ref 6–29)
AST: 15 U/L (ref 10–30)
Albumin: 3.5 g/dL — ABNORMAL LOW (ref 3.6–5.1)
Alkaline phosphatase (APISO): 44 U/L (ref 31–125)
BUN: 16 mg/dL (ref 7–25)
CO2: 27 mmol/L (ref 20–32)
Calcium: 8.5 mg/dL — ABNORMAL LOW (ref 8.6–10.2)
Chloride: 110 mmol/L (ref 98–110)
Creat: 0.94 mg/dL (ref 0.50–1.10)
GFR, Est African American: 92 mL/min/{1.73_m2} (ref >=60)
GFR, Est Non African American: 80 mL/min/{1.73_m2} (ref >=60)
Globulin: 1.8 g/dL (calc) — ABNORMAL LOW (ref 1.9–3.7)
Glucose, Bld: 85 mg/dL (ref 65–99)
Potassium: 3.9 mmol/L (ref 3.5–5.3)
Sodium: 143 mmol/L (ref 135–146)
Total Bilirubin: 0.4 mg/dL (ref 0.2–1.2)
Total Protein: 5.3 g/dL — ABNORMAL LOW (ref 6.1–8.1)

## 2019-04-06 LAB — QUANTIFERON-TB GOLD PLUS
Mitogen-NIL: >10 IU/mL
NIL: 0.02 IU/mL
QuantiFERON-TB Gold Plus: NEGATIVE
TB1-NIL: 0.04 IU/mL
TB2-NIL: 0.04 IU/mL

## 2019-04-08 NOTE — Progress Notes (Signed)
Virtual Visit via Video Note  I connected with Hayley ReasSara Dickens on 04/15/19 at  9:15 AM EDT by a video enabled telemedicine application and verified that I am speaking with the correct person using two identifiers.   I discussed the limitations of evaluation and management by telemedicine and the availability of in person appointments. The patient expressed understanding and agreed to proceed. This service was conducted via virtual visit.  Both audio and visual tools were used.  The patient was located at home. I was located in my office.  Consent was obtained prior to the virtual visit and is aware of possible charges through their insurance for this visit.  The patient is an established patient.  Dr. Corliss Skainseveshwar, MD conducted the virtual visit and Sherron Alesaylor Dale, PA-C acted as scribe during the service.  Office staff helped with scheduling follow up visits after the service was conducted.   CC: Medication monitoring   History of Present Illness: Patient is a [age] year female with a past medical history of seropositive rheumatoid arthritis.  She is on Cimzia 400 mg sq every 28 days, MTX 0.8 ml sq once weekly, and folic acid 2 mg po daily.   She denies any recent flares.  She denies any joint pain or joint swelling.  She denies any morning stiffness.  She has no difficulties with ADLs. She needs a refill of MTX.    Review of Systems  Constitutional: Negative for fever and malaise/fatigue.  Eyes: Negative for photophobia, pain, discharge and redness.       +Eye dryness   Respiratory: Negative for cough, shortness of breath and wheezing.   Cardiovascular: Negative for chest pain and palpitations.  Gastrointestinal: Negative for blood in stool, constipation and diarrhea.  Genitourinary: Negative for dysuria.  Musculoskeletal: Negative for back pain, joint pain, myalgias and neck pain.  Skin: Negative for rash.  Neurological: Negative for dizziness and headaches.  Psychiatric/Behavioral: Negative for  depression. The patient is not nervous/anxious and does not have insomnia.      Observations/Objective: Physical Exam  Constitutional: She is oriented to person, place, and time and well-developed, well-nourished, and in no distress.  HENT:  Head: Normocephalic and atraumatic.  Eyes: Conjunctivae are normal.  Pulmonary/Chest: Effort normal.  Neurological: She is alert and oriented to person, place, and time.  Psychiatric: Mood, memory, affect and judgment normal.   Patient reports morning stiffness for 0  minutes.   Patient denies nocturnal pain.  Difficulty dressing/grooming: Denies Difficulty climbing stairs: Denies Difficulty getting out of chair: Denies Difficulty using hands for taps, buttons, cutlery, and/or writing: Denies  Assessment and Plan: Rheumatoid arthritis with rheumatoid factor of multiple sites without organ or systems involvement (HCC) - She has tried Humira, Enbrel, Orencia and Kevzara in the past: She is clinically doing well on Cimzia 400 mg sq injections every 28 days, MTX 0.8 ml sq once weekly, and folic acid 2 mg by mouth daily.  She has not had any recent flares.  She has no joint pain or joint swelling. She has no morning stiffness.  She has no difficulty with ADLs.  She will continue on this current treatment regimen. A refill of MTX will be sent to the pharmacy.  She was advised to notify us if she develops increased joint pain or joint swelling.  She will follow up in 3 months.   High risk medication use -Current regimen includes Cimzia 400 mg every 28 days in office started on 08/21/18, methotrexate 0.668mls weekly, and folic acid 2  mg daily. She has history of non-complaince and has failed Kevzara, Humira, Enbrel, and Orencia.  Last TB gold negative on 04/04/19.  CBC and CMP were drawn on 04/04/19. Calcium was low.  She was advised to take 1200 mg daily and increase dietary intake.  We will check vitamin D level with next lab work.  Future order will be placed today.  She will be due to update labs in July and every 3 months.   Standing orders placed today.  She was advised to hold Cimzia and MTX if she develops an infection and to resume once the infection clears.  Social distancing and standard precautions recommended by the CDC were discussed.   Folliculitis: Bilateral axilla-Resolved   Trigger finger, right middle finger: Resolved.   Follow Up Instructions: She will follow up in 3 months.   She is due for lab work in July and every 3 months.  Standing orders placed today. Future order for vitamin D was placed today.   A refill of MTX will be sent to the pharmacy.    I discussed the assessment and treatment plan with the patient. The patient was provided an opportunity to ask questions and all were answered. The patient agreed with the plan and demonstrated an understanding of the instructions.   The patient was advised to call back or seek an in-person evaluation if the symptoms worsen or if the condition fails to improve as anticipated.  I provided 25 minutes of non-face-to-face time during this encounter. Pollyann Savoy, MD   Scribed by-  Gearldine Bienenstock, PA-C

## 2019-04-15 ENCOUNTER — Other Ambulatory Visit: Payer: Self-pay

## 2019-04-15 ENCOUNTER — Telehealth (INDEPENDENT_AMBULATORY_CARE_PROVIDER_SITE_OTHER): Payer: Medicaid Other | Admitting: Rheumatology

## 2019-04-15 ENCOUNTER — Encounter: Payer: Self-pay | Admitting: Rheumatology

## 2019-04-15 DIAGNOSIS — L739 Follicular disorder, unspecified: Secondary | ICD-10-CM

## 2019-04-15 DIAGNOSIS — M65331 Trigger finger, right middle finger: Secondary | ICD-10-CM

## 2019-04-15 DIAGNOSIS — M0579 Rheumatoid arthritis with rheumatoid factor of multiple sites without organ or systems involvement: Secondary | ICD-10-CM | POA: Diagnosis not present

## 2019-04-15 DIAGNOSIS — Z79899 Other long term (current) drug therapy: Secondary | ICD-10-CM

## 2019-04-15 DIAGNOSIS — E559 Vitamin D deficiency, unspecified: Secondary | ICD-10-CM

## 2019-04-15 DIAGNOSIS — Z862 Personal history of diseases of the blood and blood-forming organs and certain disorders involving the immune mechanism: Secondary | ICD-10-CM

## 2019-04-15 MED ORDER — METHOTREXATE SODIUM CHEMO INJECTION 50 MG/2ML: 20.0000 mg | INTRAMUSCULAR | 0 refills | Status: DC

## 2019-04-21 MED FILL — CIMZIA 200 MG/ML SYRN KIT: 2 X 200 | 28 days supply | Qty: 1 | Fill #2

## 2019-04-25 ENCOUNTER — Other Ambulatory Visit: Payer: Self-pay

## 2019-04-25 ENCOUNTER — Ambulatory Visit: Payer: Medicaid Other

## 2019-05-01 ENCOUNTER — Other Ambulatory Visit: Payer: Self-pay

## 2019-05-01 ENCOUNTER — Ambulatory Visit (INDEPENDENT_AMBULATORY_CARE_PROVIDER_SITE_OTHER): Payer: Medicaid Other | Admitting: Family Medicine

## 2019-05-01 DIAGNOSIS — B001 Herpesviral vesicular dermatitis: Secondary | ICD-10-CM

## 2019-05-01 MED ORDER — VALACYCLOVIR HCL 1 G PO TABS: 1000.0000 mg | ORAL_TABLET | Freq: Two times a day (BID) | ORAL | 0 refills | Status: DC

## 2019-05-01 MED ORDER — NAPROXEN 500 MG PO TABS: ORAL_TABLET | ORAL | 0 refills | Status: AC

## 2019-05-01 NOTE — Assessment & Plan Note (Signed)
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  -Rx: valacyclovir 1g BID x7 days  -Discussed avoiding kissing/touching the area to avoid spread -May try cold compress or topical aloe vera Return precautions discussed

## 2019-05-01 NOTE — Progress Notes (Addendum)
   Subjective:   Patient ID: Hayley Burke    DOB: 04/04/1986, 33 y.o. female   MRN: [id number]  CC: medication refill   HPI: Hayley Burke is a 33 y.o. female who presents to clinic today for the following issue.  Cold sores Outbreak started yesterday around her left corner of her mouth Burning tingling sensation, has tried applying tea tree oil to dry it out  Has used Abreva in the past but has not found this to be helpful  Usually gets these sores every 5-10 years but recently has been more frequent Needs a refill of her valcyclovir  Denies any history of genital lesions  No fever, chills, cough.    Additionally, needs a refill of her naproxen.   ROS: See HPI for pertinent ROS.  Social: pt is a former smoker, quit 2013 Medications reviewed. Objective:   BP 108/68   Pulse 92   SpO2 98%  Vitals and nursing note reviewed.  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  -Rx: valacyclovir 1g BID x7 days  -Discussed avoiding kissing/touching the area to avoid spread -May try cold compress or topical aloe vera Return precautions discussed    Meds ordered this encounter  Medications  . valACYclovir (VALTREX) 1000 MG tablet    Sig: Take 1 tablet (1,000 mg total) by mouth 2 (two) times daily.    Dispense:  14 tablet    Refill:  0  . naproxen (NAPROSYN) 500 MG tablet    Sig: Take 1 tablet twice a day as needed with food for pain    Dispense:  60 tablet    Refill:  0   Freddrick March, MD [facility] PGY-3

## 2019-05-02 ENCOUNTER — Ambulatory Visit (INDEPENDENT_AMBULATORY_CARE_PROVIDER_SITE_OTHER): Payer: Medicaid Other | Admitting: *Deleted

## 2019-05-02 VITALS — BP 120/85 | HR 87

## 2019-05-02 DIAGNOSIS — M0579 Rheumatoid arthritis with rheumatoid factor of multiple sites without organ or systems involvement: Secondary | ICD-10-CM

## 2019-05-02 MED ORDER — CERTOLIZUMAB PEGOL 2 X 200 MG/ML ~~LOC~~ KIT
400.0000 mg | PACK | Freq: Once | SUBCUTANEOUS | Status: AC
  Administered 2019-05-02: 400 mg via SUBCUTANEOUS

## 2019-05-02 NOTE — Progress Notes (Signed)
Patient in office for Cimzia injection. Patient denies fever, infection or use of antibiotic. Patient was given injections in right and left lower abdomen.  Patient tolerated injections well and will return in 1 month for next injection.   Administrations This Visit    Certolizumab Pegol KIT 400 mg    Admin Date 05/02/2019 Action Given Dose 400 mg Route Subcutaneous Administered By Carole Binning, LPN

## 2019-05-19 ENCOUNTER — Other Ambulatory Visit: Payer: Self-pay | Admitting: Rheumatology

## 2019-05-19 DIAGNOSIS — M0579 Rheumatoid arthritis with rheumatoid factor of multiple sites without organ or systems involvement: Secondary | ICD-10-CM

## 2019-05-19 NOTE — Telephone Encounter (Signed)
Last Visit: 04/15/19 Next Visit: 07/16/19 Labs: 04/04/19 Calcium is borderline low.Rest of CMP stable. CBC stable TB Gold: 04/04/19 Neg   Okay to refill per Dr. Corliss Skains

## 2019-05-28 MED FILL — CIMZIA 200 MG/ML SYRN KIT: 2 X 200 | 28 days supply | Qty: 1 | Fill #0

## 2019-05-30 ENCOUNTER — Ambulatory Visit: Payer: Medicaid Other

## 2019-06-02 ENCOUNTER — Ambulatory Visit: Payer: Medicaid Other

## 2019-06-05 ENCOUNTER — Telehealth: Payer: Self-pay | Admitting: *Deleted

## 2019-06-05 ENCOUNTER — Ambulatory Visit (INDEPENDENT_AMBULATORY_CARE_PROVIDER_SITE_OTHER): Payer: Medicaid Other | Admitting: *Deleted

## 2019-06-05 ENCOUNTER — Other Ambulatory Visit: Payer: Self-pay

## 2019-06-05 VITALS — BP 118/20 | HR 79

## 2019-06-05 DIAGNOSIS — M0579 Rheumatoid arthritis with rheumatoid factor of multiple sites without organ or systems involvement: Secondary | ICD-10-CM | POA: Diagnosis not present

## 2019-06-05 MED ORDER — CERTOLIZUMAB PEGOL 2 X 200 MG/ML ~~LOC~~ KIT
400.0000 mg | PACK | Freq: Once | SUBCUTANEOUS | Status: AC
  Administered 2019-06-05: 400 mg via SUBCUTANEOUS

## 2019-06-05 NOTE — Progress Notes (Signed)
Patient in the office for Cimzia Injection. Patient purchased Cimzia. Patient received injection in right and left lower abdomen and tolerated well. Patient will return in 1 month for next injection and labs.   Administrations This Visit    Certolizumab Pegol KIT 400 mg    Admin Date 06/05/2019 Action Given Dose 400 mg Route Subcutaneous Administered By Carole Binning, LPN

## 2019-06-05 NOTE — Telephone Encounter (Signed)
Attempted to contact the patient and left message for patient to call the office to reschedule her nurse visit for Cimzia.

## 2019-06-09 NOTE — Progress Notes (Signed)
HEMATOLOGY/ONCOLOGY CLINIC NOTE  Date of Service: 06/10/19    Patient Care Team: Rory Percy, DO as PCP - General (Family Medicine)  CHIEF COMPLAINTS/PURPOSE OF CONSULTATION:  Anemia  HISTORY OF PRESENTING ILLNESS:   Hayley Burke is a wonderful 33 y.o. female who has been referred to [place] by Deberah Castle N.P for evaluation and management of anemia. She is accompanied today by her mother and daughter. The pt reports that she is doing well overall.   The pt reports that she sees Dr. Estanislado Pandy for her RA. She notes that she has taken Metholtrexate 0.8 mL for 6 years.  She was first diagnosed with RA 6 years ago, she has also tried Plaquinil,  Humara, and Enbrel in the past. She notes that she has been on biologics since being diagnosed. She takes 57m Folic acid replacement daily. She notes that she receives prednisone during her RA flare ups.   She notes that she first learned of her anemia a year ago. She noticed that her nails were getting more brittle, and that she was more tired.  She notes that she took Ferrous sulfate in the past, during her pregnancy 4 years ago.  She notes that she has ice cravings. She notes that she has not had a period since beginning SLindain January. She notes that she has had normal to slightly heavy before this. She has never had IV iron nor a blood transfusion.   She notes that she takes 2-4 tablets of Ibuprofen daily for 6-7 years for chronic headaches and migraines. She denies any stomach issues and does not take OTC anti acids. She notes that she has no drug allergies besides Sulfa antibiotics which was accompanied by hives.   Most recent lab results (02/22/18) of CBC  is as follows: all values are WNL except for Hgb at 11.0, RDW at 17.6, EOS Abs aty 1.1k. Ferritin 02/22/18 was low at 13. Vitamin B12 02/22/18 was slightly elevated at 1318.  Iron and TIBC 02/22/18 showed Iron low at 15 and Iron Sat low at 5%  On review of systems, pt reports being  tired, occasional mouth sores, and denies blood in the stools, melena, any bleeding, sudden weight loss, fevers, chills, night sweats, noticing any enlarged lymph nodes, abdominal pains, leg swelling and any other symptoms.   On PMHx the pt reports Rheumatoid Arthritis, and anemia. On Surgical Hx the pt reports adenoid surgery, caesarean section with a post-operative hematoma, and tonsilectomy.  On Social Hx the pt denies much ETOH use and quit smoking 5 years ago.  On Family Hx the pt reports her father had lung cancer, her cousins had prostate cancer.  Interval History:   SViolet Seaburyreturns today regarding her iron deficiency anemia. The patient's last visit with [place] on 12/10/18. The pt reports that she is doing well overall.  The pt reports that she has not been taking PO Iron replacement as she could not tolerate different preparations with GI intolerance. She notes that her fingernails have been more brittle in the last month which she associates with previous times of iron deficiency. She denies ice cravings. She notes that her RA is still well controlled with Cimzia. She notes that she has stable energy levels overall and denies fatigue or concerns for blood loss. She has an IUD and denies menstrual losses whatsoever. The pt notes that she has been taking PO calcium replacement.  The pt has continued on SL 10028m Vitamin B12. She also continues on folic  acid replacement and methotrexate.  Lab results today (06/10/19) of CBC w/diff and CMP is as follows: all values are WNL except for HGB at 11.9, Eosinophils abs at 1.4k, Glucose at 109, Calcium at 8.0, Total Protein at 5.6, Albumin at 3.2, Total Bilirubin at 0.2. 06/10/19 Ferritin is low a 7 06/10/19 Vitamin B12 is pending  On review of systems, pt reports stable energy levels, stable weight, and denies concerns for bleeding, fatigue, blood in the stools, black stools, unexpected weight loss, and any other symptoms.   MEDICAL HISTORY:   Past Medical History:  Diagnosis Date  . Rheumatoid arthritis ([place])     SURGICAL HISTORY: Past Surgical History:  Procedure Laterality Date  . CESAREAN SECTION    . TONSILLECTOMY AND ADENOIDECTOMY      SOCIAL HISTORY: Social History   Socioeconomic History  . Marital status: Unknown    Spouse name: Not on file  . Number of children: Not on file  . Years of education: Not on file  . Highest education level: Not on file  Occupational History  . Not on file  Social Needs  . Financial resource strain: Not on file  . Food insecurity    Worry: Not on file    Inability: Not on file  . Transportation needs    Medical: Not on file    Non-medical: Not on file  Tobacco Use  . Smoking status: Former Smoker    Packs/day: 1.00    Years: 15.00    Pack years: 15.00    Types: Cigarettes    Quit date: 06/26/2012    Years since quitting: 6.9  . Smokeless tobacco: Never Used  Substance and Sexual Activity  . Alcohol use: No  . Drug use: No  . Sexual activity: Yes  Lifestyle  . Physical activity    Days per week: Not on file    Minutes per session: Not on file  . Stress: Not on file  Relationships  . Social Herbalist on phone: Not on file    Gets together: Not on file    Attends religious service: Not on file    Active member of club or organization: Not on file    Attends meetings of clubs or organizations: Not on file    Relationship status: Not on file  . Intimate partner violence    Fear of current or ex partner: Not on file    Emotionally abused: Not on file    Physically abused: Not on file    Forced sexual activity: Not on file  Other Topics Concern  . Not on file  Social History Narrative  . Not on file    FAMILY HISTORY: Family History  Problem Relation Age of Onset  . Sjogren's syndrome Mother   . Breast cancer Mother   . Depression Mother   . Lung cancer Father   . Hyperlipidemia Father   . Drug abuse Brother   . Heart attack Paternal  Grandfather     ALLERGIES:  is allergic to sulfa antibiotics.  MEDICATIONS:  Current Outpatient Medications  Medication Sig Dispense Refill  . aspirin-acetaminophen-caffeine (EXCEDRIN MIGRAINE) 250-250-65 MG tablet Take 1 tablet by mouth every 6 (six) hours as needed for headache.    . Biotin 10000 MCG TABS Take by mouth.    . Calcium Carbonate-Vit D-Min (CALCIUM 1200 PO) Take by mouth.    Marland Kitchen CIMZIA PREFILLED 2 X 200 MG/ML KIT INJECT 400 MG INTO THE SKIN EVERY 28 (TWENTY-EIGHT) DAYS.  1 kit 0  . Cyanocobalamin (VITAMIN B-12 PO) Take by mouth daily.    . cycloSPORINE (RESTASIS) 0.05 % ophthalmic emulsion as needed.    . folic acid (FOLVITE) 1 MG tablet Take 2 tablets (2 mg total) by mouth daily. 60 tablet 3  . iron polysaccharides (NIFEREX) 150 MG capsule Take 1 capsule (150 mg total) by mouth daily. 30 capsule 5  . Levonorgestrel (KYLEENA) 19.5 MG IUD Kyleena 17.5 mcg/24 hour (5 years) intrauterine device  TO BE INSERTED ONE TIME BY PRESCRIBER. ROUTE INTRAUTERINE.    . methotrexate 50 MG/2ML injection Inject 0.8 mLs (20 mg total) into the skin once a week. 10 mL 0  . naproxen (NAPROSYN) 500 MG tablet Take 1 tablet twice a day as needed with food for pain 60 tablet 0  . TUBERCULIN SYR 1CC/27GX1/2" 27G X 1/2" 1 ML MISC Use 1 weekly to inject Methotrexate 12 each 3  . TURMERIC PO Take by mouth daily.    . valACYclovir (VALTREX) 1000 MG tablet Take 1 tablet (1,000 mg total) by mouth 2 (two) times daily. 14 tablet 0   No current facility-administered medications for this visit.     REVIEW OF SYSTEMS:    A 10+ POINT REVIEW OF SYSTEMS WAS OBTAINED including neurology, dermatology, psychiatry, cardiac, respiratory, lymph, extremities, GI, GU, Musculoskeletal, constitutional, breasts, reproductive, HEENT.  All pertinent positives are noted in the HPI.  All others are negative.   PHYSICAL EXAMINATION:  . Vitals:   06/10/19 1349  BP: 96/65  Pulse: (!) 110  Resp: 18  Temp: 98.2 F (36.8 C)   SpO2: 97%   Filed Weights   06/10/19 1349  Weight: 144 lb 6.4 oz (65.5 kg)   .Body mass index is 24.03 kg/m.  GENERAL:alert, in no acute distress and comfortable SKIN: no acute rashes, no significant lesions EYES: conjunctiva are pink and non-injected, sclera anicteric OROPHARYNX: MMM, no exudates, no oropharyngeal erythema or ulceration NECK: supple, no JVD LYMPH:  no palpable lymphadenopathy in the cervical, axillary or inguinal regions LUNGS: clear to auscultation b/l with normal respiratory effort HEART: regular rate & rhythm ABDOMEN:  normoactive bowel sounds , non tender, not distended. No palpable hepatosplenomegaly.  Extremity: no pedal edema PSYCH: alert & oriented x 3 with fluent speech NEURO: no focal motor/sensory deficits   LABORATORY DATA:  I have reviewed the data as listed  . CBC Latest Ref Rng & Units 06/10/2019 04/04/2019 12/10/2018  WBC 4.0 - 10.5 K/uL 6.5 6.1 10.6(H)  Hemoglobin 12.0 - 15.0 g/dL 11.9(L) 13.0 13.4  Hematocrit 36.0 - 46.0 % 36.8 38.4 41.1  Platelets 150 - 400 K/uL 262 268 278  HGB 10.6 ANC 6.1k  . CMP Latest Ref Rng & Units 06/10/2019 04/04/2019 12/10/2018  Glucose 70 - 99 mg/dL 109(H) 85 76  BUN 6 - 20 mg/dL _0 Creatinine 0.44 - 1.00 mg/dL 0.85 0.94 0.77  Sodium 135 - 145 mmol/L 140 143 142  Potassium 3.5 - 5.1 mmol/L 3.6 3.9 4.1  Chloride 98 - 111 mmol/L 108 110 108  CO2 22 - 32 mmol/L _1 Calcium 8.9 - 10.3 mg/dL 8.0(L) 8.5(L) 8.4(L)  Total Protein 6.5 - 8.1 g/dL 5.6(L) 5.3(L) 5.6(L)  Total Bilirubin 0.3 - 1.2 mg/dL 0.2(L) 0.4 <0.2(L)  Alkaline Phos 38 - 126 U/L 52 - 47  AST 15 - 41 U/L _2 ALT 0 - 44 U/L _3 . Lab Results  Component Value Date  IRON 49 06/10/2019   TIBC 280 06/10/2019   IRONPCTSAT 17 (L) 06/10/2019   (Iron and TIBC)  Lab Results  Component Value Date   FERRITIN 7 (L) 06/10/2019    RADIOGRAPHIC STUDIES: I have personally reviewed the radiological images as listed and agreed  with the findings in the report. No results found.  ASSESSMENT & PLAN:  33 y.o. female with  1. Iron Deficiency Anemia -Discussed that her Hgb was around 14 as of 2 years ago, but had dropped to 9.5 in 09/2016. Ferritin was at 13 on 02/22/18.  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  3. h/o B12 deficiency . 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 Could be a cause of the Iron and B12 deficiency B12 currentl WNL @ 1021  PLAN -Discussed pt labwork today, 06/10/19; HGB borderline low at 11.9 with 17% saturation ratio. Ferritin low at 7. Calcium lower at 8.0. -06/10/19 Vitamin B12 is >1000 -Will order IV Injectafer x weekly for 2 doses  -Goal for Ferritin >100 -Pt will try PO 115m Iron Polysaccharide which tends to be better tolerated, for maintenance -Continue SL 10080m Vitamin B12 -Recommend 2000 units Vitamin D daily and maintain bone health in setting of RA -Pt is clinically much improved. RA well controlled with Cimzia. Blood loss from periods improved with IUD tolerance. Previous frequent NSAID use now discontinued and actively avoided.  -Continue SL Vitamin B12 -With RA present, would prefer ferritin to be around 100 -Recommend taking Vitamin B complex given antiparietal cell antibody -Continue eating iron rich foods -Will continue to trend out Ferritin in 6 months again, and will then consider once a year visits    -Will see the pt back in 6 months   IV Iron weekly x 2 doses starting in 1-2 weeks RTC with Dr KaIrene Limboith labs in 6 months   All of the patients questions were answered with apparent satisfaction. The patient knows to call the clinic with any problems, questions or concerns.  The total time spent in the appt was 15 minutes and more than 50% was on counseling and direct patient cares.    GaSullivan LoneD MS AAHIVMS  SCTouchette Regional Hospital IncTSt. Albans Community Living Centerematology/Oncology Physician CoNiobrara Valley Hospital(Office):       [phone] cell):  [phone]):           [phone]/23/2020 2:40 PM  I, ScBaldwin Jamaicaam acting as a scribe for Dr. GaSullivan Lone  .I have reviewed the above documentation for accuracy and completeness, and I agree with the above. .GBrunetta GeneraD

## 2019-06-10 ENCOUNTER — Inpatient Hospital Stay: Payer: Medicaid Other | Attending: Hematology | Admitting: Hematology

## 2019-06-10 ENCOUNTER — Other Ambulatory Visit: Payer: Self-pay

## 2019-06-10 ENCOUNTER — Inpatient Hospital Stay: Payer: Medicaid Other

## 2019-06-10 VITALS — BP 96/65 | HR 110 | Temp 98.2°F | Resp 18 | Ht 65.0 in | Wt 144.4 lb

## 2019-06-10 DIAGNOSIS — D509 Iron deficiency anemia, unspecified: Secondary | ICD-10-CM | POA: Diagnosis not present

## 2019-06-10 DIAGNOSIS — D510 Vitamin B12 deficiency anemia due to intrinsic factor deficiency: Secondary | ICD-10-CM

## 2019-06-10 DIAGNOSIS — E538 Deficiency of other specified B group vitamins: Secondary | ICD-10-CM

## 2019-06-10 DIAGNOSIS — M069 Rheumatoid arthritis, unspecified: Secondary | ICD-10-CM | POA: Diagnosis not present

## 2019-06-10 DIAGNOSIS — Z79899 Other long term (current) drug therapy: Secondary | ICD-10-CM | POA: Diagnosis not present

## 2019-06-10 DIAGNOSIS — Z87891 Personal history of nicotine dependence: Secondary | ICD-10-CM

## 2019-06-10 DIAGNOSIS — D508 Other iron deficiency anemias: Secondary | ICD-10-CM

## 2019-06-10 LAB — CBC WITH DIFFERENTIAL/PLATELET
Abs Immature Granulocytes: 0.01 10*3/uL (ref 0.00–0.07)
Basophils Absolute: 0 10*3/uL (ref 0.0–0.1)
Basophils Relative: 1 %
Eosinophils Absolute: 1.4 10*3/uL — ABNORMAL HIGH (ref 0.0–0.5)
Eosinophils Relative: 22 %
HCT: 36.8 % (ref 36.0–46.0)
Hemoglobin: 11.9 g/dL — ABNORMAL LOW (ref 12.0–15.0)
Immature Granulocytes: 0 %
Lymphocytes Relative: 22 %
Lymphs Abs: 1.4 10*3/uL (ref 0.7–4.0)
MCH: 30.5 pg (ref 26.0–34.0)
MCHC: 32.3 g/dL (ref 30.0–36.0)
MCV: 94.4 fL (ref 80.0–100.0)
Monocytes Absolute: 0.4 10*3/uL (ref 0.1–1.0)
Monocytes Relative: 7 %
Neutro Abs: 3.2 10*3/uL (ref 1.7–7.7)
Neutrophils Relative %: 48 %
Platelets: 262 10*3/uL (ref 150–400)
RBC: 3.9 MIL/uL (ref 3.87–5.11)
RDW: 13.7 % (ref 11.5–15.5)
WBC: 6.5 10*3/uL (ref 4.0–10.5)
nRBC: 0 % (ref 0.0–0.2)

## 2019-06-10 LAB — CMP (CANCER CENTER ONLY)
ALT: 19 U/L (ref 0–44)
AST: 23 U/L (ref 15–41)
Albumin: 3.2 g/dL — ABNORMAL LOW (ref 3.5–5.0)
Alkaline Phosphatase: 52 U/L (ref 38–126)
Anion gap: 6 (ref 5–15)
BUN: 12 mg/dL (ref 6–20)
CO2: 26 mmol/L (ref 22–32)
Calcium: 8 mg/dL — ABNORMAL LOW (ref 8.9–10.3)
Chloride: 108 mmol/L (ref 98–111)
Creatinine: 0.85 mg/dL (ref 0.44–1.00)
GFR, Est AFR Am: >60 mL/min (ref >60)
GFR, Estimated: >60 mL/min (ref >60)
Glucose, Bld: 109 mg/dL — ABNORMAL HIGH (ref 70–99)
Potassium: 3.6 mmol/L (ref 3.5–5.1)
Sodium: 140 mmol/L (ref 135–145)
Total Bilirubin: 0.2 mg/dL — ABNORMAL LOW (ref 0.3–1.2)
Total Protein: 5.6 g/dL — ABNORMAL LOW (ref 6.5–8.1)

## 2019-06-10 LAB — VITAMIN B12: Vitamin B-12: 4382 pg/mL — ABNORMAL HIGH (ref 180–914)

## 2019-06-10 LAB — IRON AND TIBC
Iron: 49 ug/dL (ref 41–142)
Saturation Ratios: 17 % — ABNORMAL LOW (ref 21–57)
TIBC: 280 ug/dL (ref 236–444)
UIBC: 232 ug/dL (ref 120–384)

## 2019-06-10 LAB — FERRITIN: Ferritin: 7 ng/mL — ABNORMAL LOW (ref 11–307)

## 2019-06-10 MED ORDER — POLYSACCHARIDE IRON COMPLEX 150 MG PO CAPS: 150.0000 mg | ORAL_CAPSULE | Freq: Every day | ORAL | 5 refills | Status: DC

## 2019-06-11 ENCOUNTER — Telehealth: Payer: Self-pay | Admitting: Hematology

## 2019-06-11 NOTE — Telephone Encounter (Signed)
Scheduled appt per 6/23 los. Spoke with patient and she is aware of her appt date and time. °

## 2019-06-18 ENCOUNTER — Other Ambulatory Visit: Payer: Self-pay | Admitting: Rheumatology

## 2019-06-18 DIAGNOSIS — M0579 Rheumatoid arthritis with rheumatoid factor of multiple sites without organ or systems involvement: Secondary | ICD-10-CM

## 2019-06-18 NOTE — Telephone Encounter (Signed)
Last Visit: 04/15/19 Next Visit: 07/16/19 Labs:06/10/19 Glucose 109, Calcium 8, Total Protein 5.6 Albumin 3.2 Total Bilirubin 0.2 Hgb 11.9 Eosinophils Abs 1.4  TB Gold: 04/04/19 Neg   Okay to refill per Dr. Estanislado Pandy

## 2019-06-23 ENCOUNTER — Other Ambulatory Visit: Payer: Self-pay

## 2019-06-23 ENCOUNTER — Telehealth: Payer: Self-pay | Admitting: Rheumatology

## 2019-06-23 ENCOUNTER — Inpatient Hospital Stay: Payer: Medicaid Other | Attending: Hematology

## 2019-06-23 VITALS — BP 121/63 | HR 84 | Temp 97.3°F | Resp 16

## 2019-06-23 DIAGNOSIS — D509 Iron deficiency anemia, unspecified: Secondary | ICD-10-CM

## 2019-06-23 MED ORDER — SODIUM CHLORIDE 0.9 % IV SOLN
INTRAVENOUS | Status: DC
  Administered 2019-06-23: 11:00:00 via INTRAVENOUS
  Filled 2019-06-23 (×2): qty 250

## 2019-06-23 MED ORDER — SODIUM CHLORIDE 0.9 % IV SOLN
750.0000 mg | Freq: Once | INTRAVENOUS | Status: AC
  Administered 2019-06-23: 750 mg via INTRAVENOUS
  Filled 2019-06-23: qty 15

## 2019-06-23 NOTE — Patient Instructions (Signed)

## 2019-06-23 NOTE — Telephone Encounter (Signed)
Ok to prescribe tablets of methotrexate instead of the injectable form.

## 2019-06-23 NOTE — Telephone Encounter (Signed)
Patient left a voicemail stating she would like to go back to taking Methotrexate in tablet form.  Patient states she is not comfortable with the injections.  Patient requested a return call.

## 2019-06-24 MED ORDER — METHOTREXATE 2.5 MG PO TABS: 20.0000 mg | ORAL_TABLET | ORAL | 0 refills | Status: DC

## 2019-06-24 NOTE — Telephone Encounter (Signed)
Patient advised okay to switch from injectable to tablets. Patient advised prescription sent to the pharmacy.

## 2019-06-26 ENCOUNTER — Telehealth: Payer: Self-pay | Admitting: *Deleted

## 2019-06-26 ENCOUNTER — Other Ambulatory Visit: Payer: Self-pay

## 2019-06-26 DIAGNOSIS — Z79899 Other long term (current) drug therapy: Secondary | ICD-10-CM

## 2019-06-26 NOTE — Telephone Encounter (Signed)
Patient advised to come by today to update CBC and CMP. Patient verbalized understanding.

## 2019-06-26 NOTE — Telephone Encounter (Signed)
Patient state she took MTX 8 tabs on 06/24/19 and 06/25/19. Patient state she normal dose is on Wednesdays. Patient states she was behind on her MTX due to being on the injectable and not wanting to use it. Patient states was having nausea and headache 06/25/19. Patient states she is feeling better today. Please advise.

## 2019-06-26 NOTE — Telephone Encounter (Signed)
Please advise patient to come by today to update CBC and CMP.

## 2019-06-27 LAB — CBC WITH DIFFERENTIAL/PLATELET
Absolute Monocytes: 402 cells/uL (ref 200–950)
Basophils Absolute: 39 cells/uL (ref 0–200)
Basophils Relative: 0.7 %
Eosinophils Absolute: 578 cells/uL — ABNORMAL HIGH (ref 15–500)
Eosinophils Relative: 10.5 %
HCT: 38.7 % (ref 35.0–45.0)
Hemoglobin: 12.4 g/dL (ref 11.7–15.5)
Lymphs Abs: 1161 cells/uL (ref 850–3900)
MCH: 30.2 pg (ref 27.0–33.0)
MCHC: 32 g/dL (ref 32.0–36.0)
MCV: 94.4 fL (ref 80.0–100.0)
MPV: 12.1 fL (ref 7.5–12.5)
Monocytes Relative: 7.3 %
Neutro Abs: 3322 cells/uL (ref 1500–7800)
Neutrophils Relative %: 60.4 %
Platelets: 253 10*3/uL (ref 140–400)
RBC: 4.1 10*6/uL (ref 3.80–5.10)
RDW: 13.1 % (ref 11.0–15.0)
Total Lymphocyte: 21.1 %
WBC: 5.5 10*3/uL (ref 3.8–10.8)

## 2019-06-27 LAB — COMPLETE METABOLIC PANEL WITH GFR
AG Ratio: 1.9 (calc) (ref 1.0–2.5)
ALT: 14 U/L (ref 6–29)
AST: 18 U/L (ref 10–30)
Albumin: 3.7 g/dL (ref 3.6–5.1)
Alkaline phosphatase (APISO): 49 U/L (ref 31–125)
BUN: 17 mg/dL (ref 7–25)
CO2: 25 mmol/L (ref 20–32)
Calcium: 8.4 mg/dL — ABNORMAL LOW (ref 8.6–10.2)
Chloride: 108 mmol/L (ref 98–110)
Creat: 0.8 mg/dL (ref 0.50–1.10)
GFR, Est African American: 112 mL/min/{1.73_m2} (ref >=60)
GFR, Est Non African American: 97 mL/min/{1.73_m2} (ref >=60)
Globulin: 1.9 g/dL (calc) (ref 1.9–3.7)
Glucose, Bld: 102 mg/dL — ABNORMAL HIGH (ref 65–99)
Potassium: 4.1 mmol/L (ref 3.5–5.3)
Sodium: 140 mmol/L (ref 135–146)
Total Bilirubin: 0.2 mg/dL (ref 0.2–1.2)
Total Protein: 5.6 g/dL — ABNORMAL LOW (ref 6.1–8.1)

## 2019-06-27 NOTE — Progress Notes (Signed)
LFTs and kidney function are WNL.  Dr. Estanislado Pandy would like the patient to skip 1 week of MTX due to her taking 2 doses this week.  Rest of labs are stable.  Calcium is chronically low but improving.  Please advise patient to take a vitamin D supplement.   We will continue to monitor lab work.

## 2019-06-30 ENCOUNTER — Other Ambulatory Visit: Payer: Self-pay

## 2019-06-30 ENCOUNTER — Inpatient Hospital Stay: Payer: Medicaid Other

## 2019-06-30 VITALS — BP 99/56 | HR 63 | Temp 98.3°F | Resp 18

## 2019-06-30 DIAGNOSIS — D509 Iron deficiency anemia, unspecified: Secondary | ICD-10-CM

## 2019-06-30 MED ORDER — SODIUM CHLORIDE 0.9 % IV SOLN
750.0000 mg | Freq: Once | INTRAVENOUS | Status: AC
  Administered 2019-06-30: 750 mg via INTRAVENOUS
  Filled 2019-06-30: qty 15

## 2019-06-30 MED ORDER — SODIUM CHLORIDE 0.9 % IV SOLN
Freq: Once | INTRAVENOUS | Status: AC
  Administered 2019-06-30: 10:00:00 via INTRAVENOUS
  Filled 2019-06-30: qty 250

## 2019-06-30 MED FILL — CIMZIA 200 MG/ML SYRN KIT: 2 X 200 | 28 days supply | Qty: 1 | Fill #0

## 2019-06-30 NOTE — Patient Instructions (Signed)

## 2019-07-03 ENCOUNTER — Ambulatory Visit: Payer: Medicaid Other

## 2019-07-03 ENCOUNTER — Telehealth: Payer: Self-pay | Admitting: *Deleted

## 2019-07-03 NOTE — Telephone Encounter (Signed)
Patient's Cimzia was delivered to the office 07/02/19 and placed in the fridge.  Patient contacted the office this morning stating she has had a pretty bad headache since last night and would like to reschedule her Cimzia appointment. Patient states she has had not fever or any other symptoms. Patient has been rescheduled to 07/08/19 at 10 am. Patient advised to contact the office to reschedule if she is still not feeling well.

## 2019-07-03 NOTE — Progress Notes (Signed)
Office Visit Note  Patient: Hayley Burke             Date of Birth: [birth year]           MRN: [id number]             PCP: Rory Percy, DO Referring: Rory Percy, DO Visit Date: 07/16/2019 Occupation: '@GUAROCC'$ @  Subjective:  Medication monitoring     History of Present Illness: Hayley Burke is a 33 y.o. female with history of seropositive rheumatoid arthritis.  Patient is on Cimzia 400 mg subcutaneous injections every 28 days, methotrexate 8 tablets by mouth once weekly, folic acid 2 mg by mouth daily.  She recently switched from the injectable form of methotrexate 2 tablets.  She misunderstood the dosing and took 8 tablets by mouth 2 days in a row.  She states that she did experience increased fatigue and mild nausea after taking a second dose of methotrexate.  She has been holding her methotrexate for the past 2 weeks.  She denies any recent rheumatoid arthritis flares.  She denies any joint pain or joint swelling at this time.  She states that if she started or performs weight lifting exercises she develops increased pain in her hands and wrist which resolves on its own.  She has not been on any prednisone recently.  She has no morning stiffness.  Activities of Daily Living:  Patient reports morning stiffness for 0 minutes.   Patient Denies nocturnal pain.  Difficulty dressing/grooming: Denies Difficulty climbing stairs: Denies Difficulty getting out of chair: Denies Difficulty using hands for taps, buttons, cutlery, and/or writing: Denies  Review of Systems  Constitutional: Negative for fatigue.  HENT: Negative for mouth sores, mouth dryness and nose dryness.   Eyes: Negative for pain, visual disturbance and dryness.  Respiratory: Negative for cough, hemoptysis, shortness of breath and difficulty breathing.   Cardiovascular: Negative for chest pain, palpitations, hypertension and swelling in legs/feet.  Gastrointestinal: Negative for blood in stool, constipation and diarrhea.    Endocrine: Negative for increased urination.  Genitourinary: Negative for painful urination.  Musculoskeletal: Negative for arthralgias, joint pain, joint swelling, myalgias, muscle weakness, morning stiffness, muscle tenderness and myalgias.  Skin: Negative for color change, pallor, rash, hair loss, nodules/bumps, skin tightness, ulcers and sensitivity to sunlight.  Allergic/Immunologic: Negative for susceptible to infections.  Neurological: Negative for dizziness, numbness, headaches and weakness.  Hematological: Negative for swollen glands.  Psychiatric/Behavioral: Negative for depressed mood and sleep disturbance. The patient is not nervous/anxious.     PMFS History:  Patient Active Problem List   Diagnosis Date Noted   IUD (intrauterine device) in place 07/07/2019   Pernicious anemia 03/20/2018   Rheumatoid arthritis with rheumatoid factor of multiple sites without organ or systems involvement ([place]) 05/03/2017   High risk medications (not anticoagulants) long-term use 05/03/2017   Family history of breast cancer 01/19/2016    Past Medical History:  Diagnosis Date   Rheumatoid arthritis ([place])     Family History  Problem Relation Age of Onset   Sjogren's syndrome Mother    Breast cancer Mother    Depression Mother    Lung cancer Father    Hyperlipidemia Father    Drug abuse Brother    Heart attack Paternal Grandfather    Past Surgical History:  Procedure Laterality Date   CESAREAN SECTION     TONSILLECTOMY AND ADENOIDECTOMY     Social History   Social History Narrative   Not on file   Immunization History  Administered  Date(s) Administered   Influenza,inj,Quad PF,6+ Mos 11/07/2018   Pneumococcal Polysaccharide-23 01/06/2016   Tdap 05/12/2018     Objective: Vital Signs: BP 98/64 (BP Location: Left Arm, Patient Position: Sitting, Cuff Size: Normal)    Pulse 79    Resp 12    Ht '5\' 5"'$  (1.651 m)    Wt 141 lb (64 kg)    BMI 23.46 kg/m     Physical Exam Vitals signs and nursing note reviewed.  Constitutional:      Appearance: She is well-developed.  HENT:     Head: Normocephalic and atraumatic.  Eyes:     Conjunctiva/sclera: Conjunctivae normal.  Neck:     Musculoskeletal: Normal range of motion.  Cardiovascular:     Rate and Rhythm: Normal rate and regular rhythm.     Heart sounds: Normal heart sounds.  Pulmonary:     Effort: Pulmonary effort is normal.     Breath sounds: Normal breath sounds.  Abdominal:     General: Bowel sounds are normal.     Palpations: Abdomen is soft.  Lymphadenopathy:     Cervical: No cervical adenopathy.  Skin:    General: Skin is warm and dry.     Capillary Refill: Capillary refill takes less than 2 seconds.  Neurological:     Mental Status: She is alert and oriented to person, place, and time.  Psychiatric:        Behavior: Behavior normal.      Musculoskeletal Exam: C-spine, thoracic spine, lumbar spine good range of motion.  No midline spinal tenderness.  No SI joint tenderness.  Shoulder joints, elbow joints, wrist joints, MCPs, PIPs, DIPs good range of motion no synovitis.  She has complete fist formation bilaterally.  Hip joints, knee joints, ankle joints, MTPs, PIPs, DIPs good range of motion no synovitis.  No warmth or effusion bilateral knee joints.  No tenderness or swelling of ankle joints.  No tenderness of MTP joints.  No tenderness over trochanteric bursa bilaterally.  CDAI Exam: CDAI Score: 0  Patient Global: 0 mm; Provider Global: 0 mm Swollen: 0 ; Tender: 0  Joint Exam   No joint exam has been documented for this visit   There is currently no information documented on the homunculus. Go to the Rheumatology activity and complete the homunculus joint exam.  Investigation: No additional findings.  Imaging: No results found.  Recent Labs: Lab Results  Component Value Date   WBC 5.5 06/26/2019   HGB 12.4 06/26/2019   PLT 253 06/26/2019   NA 140  06/26/2019   K 4.1 06/26/2019   CL 108 06/26/2019   CO2 25 06/26/2019   GLUCOSE 102 (H) 06/26/2019   BUN 17 06/26/2019   CREATININE 0.80 06/26/2019   BILITOT 0.2 06/26/2019   ALKPHOS 52 06/10/2019   AST 18 06/26/2019   ALT 14 06/26/2019   PROT 5.6 (L) 06/26/2019   ALBUMIN 3.2 (L) 06/10/2019   CALCIUM 8.4 (L) 06/26/2019   GFRAA 112 06/26/2019   QFTBGOLDPLUS Negative 07/07/2019    Speciality Comments: No specialty comments available.  Procedures:  No procedures performed Allergies: Sulfa antibiotics   Assessment / Plan:     Visit Diagnoses: Rheumatoid arthritis with rheumatoid factor of multiple sites without organ or systems involvement ([place]) - She has tried Humira, Enbrel, Orencia and Kevzara in the past: She has no synovitis on exam.  She has not had any recent rheumatoid arthritis flares.  She is clinically doing well on Cimzia 4 mg subcutaneous injections every  28 days, methotrexate 8 tablets by mouth once weekly, folic acid 2 mg by mouth daily.  She recently switched from the injectable form to the tablets of methotrexate due to dreading self injection every week.  She misunderstood the dosing and took 8 tablets by mouth 2 days in a row.  She experienced increased fatigue and mild nausea after the second dose of methotrexate.  She had lab work drawn on 06/26/2019 that was within normal limits.  She has been holding methotrexate for the past 2 weeks.  She is on any increased joint pain or joint swelling since being off of methotrexate.  Her last Cimzia injection was on 07/08/2019.  She has no morning stiffness.  She experiences some discomfort in her hands and wrist joints after gardening and weightlifting but denies any joint swelling at that time.  She will continue on the above current treatment regimen.  She does not need any refills at this time.  She was advised to notify us if shows increased joint pain or joint swelling.  She will follow-up in the office in 5 months.  High risk  medication use - Cimzia 400 mg every 28 days in office started on 08/21/18, methotrexate 2.5 mg 8 tablets every 7 days, and folic acid 1 mg 2 tablets daily.  Last TB gold negative on 07/07/2019 and will monitor yearly.  Most recent CBC/CMP within normal limits on 06/26/2019.  Due for CBC/CMP in October and will monitor every 3 months.  Standing orders are in place.  She received the flu vaccine in November and previously Pneumovax 23.  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  According to the patient she needs to receive the MMR vaccination.  We discussed holding her medications 1 month prior to the vaccination in 1 month after the vaccination.  We provided a note stating that she does not have any restrictions before clinical rotations although she is immunocompromised.  Trigger finger, right middle finger - Resolved   Trigger finger, left middle finger - Resolved   Vitamin D deficiency - She takes a vitamin D supplement.  Folliculitis - Resolved   History of anemia -Hemoglobin was 12.4 on 06/26/2019.  She continues to follow-up with a hematologist and has had iron infusions in the past.  Orders: No orders of the defined types were placed in this encounter.  No orders of the defined types were placed in this encounter.     Follow-Up Instructions: Return in about 5 months (around 12/16/2019) for Rheumatoid arthritis.   Ofilia Neas, PA-C  Note - This record has been created using Dragon software.  Chart creation errors have been sought, but may not always  have been located. Such creation errors do not reflect on  the standard of medical care.

## 2019-07-07 ENCOUNTER — Encounter: Payer: Self-pay | Admitting: Family Medicine

## 2019-07-07 ENCOUNTER — Ambulatory Visit (INDEPENDENT_AMBULATORY_CARE_PROVIDER_SITE_OTHER): Payer: Medicaid Other | Admitting: Family Medicine

## 2019-07-07 ENCOUNTER — Other Ambulatory Visit (HOSPITAL_COMMUNITY)
Admission: RE | Admit: 2019-07-07 | Discharge: 2019-07-07 | Disposition: A | Discharge status: Home / Self Care | Payer: Medicaid Other | Source: Ambulatory Visit | Attending: Family Medicine | Admitting: Family Medicine

## 2019-07-07 ENCOUNTER — Other Ambulatory Visit: Payer: Self-pay

## 2019-07-07 VITALS — BP 106/64 | HR 92 | Wt 141.0 lb

## 2019-07-07 DIAGNOSIS — Z111 Encounter for screening for respiratory tuberculosis: Secondary | ICD-10-CM

## 2019-07-07 DIAGNOSIS — Z124 Encounter for screening for malignant neoplasm of cervix: Secondary | ICD-10-CM

## 2019-07-07 DIAGNOSIS — Z114 Encounter for screening for human immunodeficiency virus [HIV]: Secondary | ICD-10-CM

## 2019-07-07 DIAGNOSIS — Z1159 Encounter for screening for other viral diseases: Secondary | ICD-10-CM | POA: Diagnosis not present

## 2019-07-07 DIAGNOSIS — Z23 Encounter for immunization: Secondary | ICD-10-CM

## 2019-07-07 DIAGNOSIS — Z975 Presence of (intrauterine) contraceptive device: Secondary | ICD-10-CM | POA: Diagnosis not present

## 2019-07-07 MED ORDER — VITAMIN C 1000 MG PO TABS: 1000.0000 mg | ORAL_TABLET | Freq: Every day | ORAL | Status: AC

## 2019-07-07 NOTE — Progress Notes (Signed)
  Subjective:   Patient ID: Hayley Burke    DOB: 1986-04-24, 33 y.o. female   MRN: [id number]  Hayley Burke is a 33 y.o. female with a history of RA, pernicious anemia here for   Physical - needs physical form filled out for school. 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 -Needs titers for hep B antibody and quant gold for TB screening. -Also due for Pap smear.  Most recent Pap was in 2017 and was normal.  Denies any abnormal vaginal discharge or bleeding.  Has IUD in place since 2017.  Sexually active.  Has family history of mother with breast cancer at 29yo, stage 0 but did have double mastectomy.  Did not have gene testing at that time.  Denies any abnormal lumps or bumps on self breast exam. - Follows closely with Rheumatology and Hematology for RA and pernicious anemia. -Denies shortness of breath, chest pain, abdominal pain, difficulties urinating or with bowel movements, abnormal vaginal discharge, dysuria.  Review of Systems:  Per HPI.  [place], medications and smoking status reviewed.  Objective:   BP 106/64   Pulse 92   Wt 141 lb (64 kg)   SpO2 98%   BMI 23.46 kg/m  Vitals and nursing note reviewed.  General: well nourished, well developed, in no acute distress with non-toxic appearance HEENT: normocephalic, atraumatic, moist mucous membranes.  Oropharynx clear without erythema or exudate. Neck: supple, non-tender without lymphadenopathy CV: regular rate and rhythm without murmurs, rubs, or gallops, no lower extremity edema Lungs: clear to auscultation bilaterally with normal work of breathing Abdomen: soft, non-tender, non-distended, no masses or organomegaly palpable, normoactive bowel sounds Breasts: breasts appear normal, no suspicious masses, no skin or nipple changes or axillary nodes. GYN:  External genitalia within normal limits.  Vaginal mucosa pink, moist, normal rugae.  Nonfriable cervix without lesions, no discharge or bleeding noted on speculum exam.   IUD strings in place.  Bimanual exam revealed normal, nongravid uterus.  No cervical motion tenderness. No adnexal masses bilaterally.   Skin: warm, dry, no rashes or lesions Extremities: warm and well perfused, normal tone MSK: ROM grossly intact, strength intact, gait normal Neuro: Alert and oriented, speech normal  Assessment & Plan:   Well Woman Exam Normal GYN and breast exams, PAP obtained today. Given mother with h/o breast cancer at 64yo, recommend screening mammogram at 34yo. HIV screening done today. Obtained other infectious disease titers for school entrance, forms completed today. IUD to be replaced in 2022.  Handout provided regarding healthy diet and lifestyle.  Orders Placed This Encounter  Procedures  . HIV antibody (with reflex)  . Hepatitis B surface antibody,qualitative  . QuantiFERON-TB Gold Plus  . Varicella zoster antibody, IgG  . Measles/Mumps/Rubella Immunity   Meds ordered this encounter  Medications  . Ascorbic Acid (VITAMIN C) 1000 MG tablet    Sig: Take 1 tablet (1,000 mg total) by mouth daily.    Dispense:       Rory Percy, DO PGY-3, [facility] 07/07/2019 9:02 PM

## 2019-07-07 NOTE — Patient Instructions (Signed)
It was great to see you! Good luck with school!  Our plans for today:  - We updated your medication list today. - We are checking some labs today, we will call you or send you a letter if they are abnormal.   Take care and seek immediate care sooner if you develop any concerns.   Dr. [facility]  Things to do to keep yourself healthy  - Exercise at least 30-45 minutes a day, 3-4 days a week.  - Eat a low-fat diet with lots of fruits and vegetables, up to 7-9 servings per day.  - Seatbelts can save your life. Wear them always.  - Smoke detectors on every level of your home, check batteries every year.  - Eye Doctor - have an eye exam every 1-2 years  - Safe sex - if you may be exposed to STDs, use a condom.  - Alcohol -  If you drink, do it moderately, less than 2 drinks per day.  - [place]. Choose someone to speak for you if you are not able.  - Depression is common in our stressful world.If you're feeling down or losing interest in things you normally enjoy, please come in for a visit.  - Violence - If anyone is threatening or hurting you, please call immediately.  Here is an example of what a healthy plate looks like:    ? Make half your plate fruits and vegetables.     ? Focus on whole fruits.     ? Vary your veggies.  ? Make half your grains whole grains. -     ? Look for the word "whole" at the beginning of the ingredients list    ? Some whole-grain ingredients include whole oats, whole-wheat flour,        whole-grain corn, whole-grain brown rice, and whole rye.  ? Move to low-fat and fat-free milk or yogurt.  ? Vary your protein routine. - Meat, fish, poultry (chicken, [place]), eggs, beans (kidney, pinto), dairy.  ? Drink and eat less sodium, saturated fat, and added sugars.

## 2019-07-08 ENCOUNTER — Ambulatory Visit (INDEPENDENT_AMBULATORY_CARE_PROVIDER_SITE_OTHER): Payer: Medicaid Other | Admitting: *Deleted

## 2019-07-08 ENCOUNTER — Ambulatory Visit: Payer: Medicaid Other

## 2019-07-08 VITALS — BP 102/63 | HR 84

## 2019-07-08 DIAGNOSIS — M0579 Rheumatoid arthritis with rheumatoid factor of multiple sites without organ or systems involvement: Secondary | ICD-10-CM | POA: Diagnosis not present

## 2019-07-08 LAB — CYTOLOGY - PAP
Diagnosis: NEGATIVE
HPV: NOT DETECTED

## 2019-07-08 MED ORDER — CERTOLIZUMAB PEGOL 2 X 200 MG/ML ~~LOC~~ KIT
400.0000 mg | PACK | Freq: Once | SUBCUTANEOUS | Status: AC
  Administered 2019-07-08: 400 mg via SUBCUTANEOUS

## 2019-07-08 NOTE — Progress Notes (Signed)
Patient in office toady for a Cimzia injection. Patient denies fever, infection or antibiotic use. Patient given injections in right and left lower abdomen. Patient tolerated injections well. Patient will return in one month for next Cimzia injection. Patient is due for labs in October.

## 2019-07-08 NOTE — Patient Instructions (Signed)
Standing Labs We placed an order today for your standing lab work.    Please come back and get your standing labs in October and every 3 months   We have open lab daily Monday through Thursday from 8:30-12:30 PM and 1:30-4:30 PM and Friday from 8:30-12:30 PM and 1:30 -4:00 PM at the office of Dr. Shaili Deveshwar.   You may experience shorter wait times on Monday and Friday afternoons. The office is located at [address], [address], [place], [place] [address] No appointment is necessary.   Labs are drawn by [facility].  You may receive a bill from [facility] for your lab work.  If you wish to have your labs drawn at another location, please call the office 24 hours in advance to send orders.  If you have any questions regarding directions or hours of operation,  please call [phone].   Just as a reminder please drink plenty of water prior to coming for your lab work. Thanks!   

## 2019-07-09 LAB — MEASLES/MUMPS/RUBELLA IMMUNITY
MUMPS ABS, IGG: 12 AU/mL (ref >10.9)
RUBEOLA AB, IGG: <13.5 AU/mL — ABNORMAL LOW (ref >16.4)
Rubella Antibodies, IGG: 3.82 index (ref >0.99)

## 2019-07-09 LAB — VARICELLA ZOSTER ANTIBODY, IGG: Varicella zoster IgG: 1103 index (ref >165)

## 2019-07-09 LAB — QUANTIFERON-TB GOLD PLUS
QuantiFERON Mitogen Value: >10 IU/mL
QuantiFERON Nil Value: 0.01 IU/mL
QuantiFERON TB1 Ag Value: 0.02 IU/mL
QuantiFERON TB2 Ag Value: 0.03 IU/mL
QuantiFERON-TB Gold Plus: NEGATIVE

## 2019-07-09 LAB — HEPATITIS B SURFACE ANTIBODY,QUALITATIVE: Hep B Surface Ab, Qual: REACTIVE

## 2019-07-09 LAB — HIV ANTIBODY (ROUTINE TESTING W REFLEX): HIV Screen 4th Generation wRfx: NONREACTIVE

## 2019-07-16 ENCOUNTER — Ambulatory Visit: Payer: Medicaid Other | Admitting: Physician Assistant

## 2019-07-16 ENCOUNTER — Other Ambulatory Visit: Payer: Self-pay

## 2019-07-16 ENCOUNTER — Telehealth: Payer: Self-pay

## 2019-07-16 ENCOUNTER — Encounter: Payer: Self-pay | Admitting: Physician Assistant

## 2019-07-16 VITALS — BP 98/64 | HR 79 | Resp 12 | Ht 65.0 in | Wt 141.0 lb

## 2019-07-16 DIAGNOSIS — M65331 Trigger finger, right middle finger: Secondary | ICD-10-CM | POA: Diagnosis not present

## 2019-07-16 DIAGNOSIS — E559 Vitamin D deficiency, unspecified: Secondary | ICD-10-CM

## 2019-07-16 DIAGNOSIS — L739 Follicular disorder, unspecified: Secondary | ICD-10-CM

## 2019-07-16 DIAGNOSIS — M0579 Rheumatoid arthritis with rheumatoid factor of multiple sites without organ or systems involvement: Secondary | ICD-10-CM | POA: Diagnosis not present

## 2019-07-16 DIAGNOSIS — Z862 Personal history of diseases of the blood and blood-forming organs and certain disorders involving the immune mechanism: Secondary | ICD-10-CM

## 2019-07-16 DIAGNOSIS — M65332 Trigger finger, left middle finger: Secondary | ICD-10-CM | POA: Diagnosis not present

## 2019-07-16 DIAGNOSIS — Z79899 Other long term (current) drug therapy: Secondary | ICD-10-CM

## 2019-07-16 NOTE — Telephone Encounter (Signed)
LVM of result note. Asked pt to give [place] a call in the decision on the vaccination and stopping  Methotrexate. Salvatore Marvel, CMA

## 2019-07-17 NOTE — Telephone Encounter (Signed)
Received notification from [place] regarding a prior authorization for [facility]. Authorization has been APPROVED from 07/17/19 to 07/16/20.    Authorization # [id number]   1:55 PM Beatriz Chancellor, CPhT

## 2019-07-28 ENCOUNTER — Telehealth: Payer: Self-pay | Admitting: Rheumatology

## 2019-07-28 MED ORDER — PREDNISONE 5 MG PO TABS: ORAL_TABLET | ORAL | 0 refills | Status: DC

## 2019-07-28 NOTE — Telephone Encounter (Signed)
Attempted to contact the patient and left message for patient to call the office.  

## 2019-07-28 NOTE — Telephone Encounter (Signed)
Patient left a voicemail stating she is having pain in her right ankle and requesting a prescription of Prednisone be sent to [facility] at [address] in [place].

## 2019-07-28 NOTE — Telephone Encounter (Signed)
Patient states on 07/26/19 flare in right ankle started. Patient states the pain started and has not gotten better.Patient has tried heat, ice and ibuprofen. Patient has also noticed swelling and warmth. No injury. Patient states the ice makes it worse. Per Lovena Le okay to send in a Prednisone taper starting at 20 mg and tapering by 5 mg every 2 days

## 2019-07-28 NOTE — Addendum Note (Signed)
Addended by: Carole Binning on: 07/28/2019 04:08 PM   Modules accepted: Orders

## 2019-07-29 ENCOUNTER — Other Ambulatory Visit: Payer: Self-pay | Admitting: Rheumatology

## 2019-07-29 DIAGNOSIS — M0579 Rheumatoid arthritis with rheumatoid factor of multiple sites without organ or systems involvement: Secondary | ICD-10-CM

## 2019-07-29 MED FILL — CIMZIA 200 MG/ML SYRN KIT: 2 X 200 | 28 days supply | Qty: 1 | Fill #0

## 2019-07-29 NOTE — Telephone Encounter (Signed)
Last Visit: 07/16/19 Next Visit: 08/05/19 Labs: 06/26/19 LFTs and kidney function are WNL. Rest of labs are stable. TB Gold: 07/07/19 Neg   Okay to refill per Dr. Estanislado Pandy

## 2019-08-05 ENCOUNTER — Other Ambulatory Visit: Payer: Self-pay

## 2019-08-05 ENCOUNTER — Ambulatory Visit (INDEPENDENT_AMBULATORY_CARE_PROVIDER_SITE_OTHER): Payer: Medicaid Other | Admitting: *Deleted

## 2019-08-05 VITALS — BP 107/78 | HR 86

## 2019-08-05 DIAGNOSIS — M0579 Rheumatoid arthritis with rheumatoid factor of multiple sites without organ or systems involvement: Secondary | ICD-10-CM | POA: Diagnosis not present

## 2019-08-05 MED ORDER — CERTOLIZUMAB PEGOL 2 X 200 MG/ML ~~LOC~~ KIT
400.0000 mg | PACK | Freq: Once | SUBCUTANEOUS | Status: AC
  Administered 2019-08-05: 400 mg via SUBCUTANEOUS

## 2019-08-05 NOTE — Progress Notes (Signed)
Patient in office for a Cimzia injection. Patient denies fever, infection or use of antibiotics. Patient given injections in her right and left lower abdomen. Patient tolerated injections well. Patient will return in 1 month for next injection.   Administrations This Visit    Certolizumab Pegol KIT 400 mg    Admin Date 08/05/2019 Action Given Dose 400 mg Route Subcutaneous Administered By Carole Binning, LPN

## 2019-09-02 ENCOUNTER — Other Ambulatory Visit: Payer: Self-pay | Admitting: Rheumatology

## 2019-09-02 DIAGNOSIS — M0579 Rheumatoid arthritis with rheumatoid factor of multiple sites without organ or systems involvement: Secondary | ICD-10-CM

## 2019-09-03 ENCOUNTER — Telehealth: Payer: Self-pay | Admitting: Rheumatology

## 2019-09-03 ENCOUNTER — Ambulatory Visit: Payer: Medicaid Other

## 2019-09-03 MED FILL — CIMZIA 200 MG/ML SYRN KIT: 2 X 200 | 28 days supply | Qty: 1 | Fill #0

## 2019-09-03 NOTE — Telephone Encounter (Signed)
Last Visit: 07/16/19 Next Visit: 08/05/19 Labs: 06/26/19 LFTs and kidney function are WNL. Rest of labs are stable. TB Gold: 07/07/19 Neg   Okay to refill per Dr. Deveshwar  

## 2019-09-03 NOTE — Telephone Encounter (Signed)
Patient left a voicemail cancelling her appointment today due to having class.  Patient is available to reschedule tomorrow 9/17 between 11:30 and 2:00 pm or on Friday, 7/18 before 12:00 pm.

## 2019-09-03 NOTE — Telephone Encounter (Signed)
Attempted to contact the patient and left message for patient to call the office.  

## 2019-09-04 NOTE — Telephone Encounter (Signed)
Cimzia prescription delivered by courier from [facility].  Placed in [place].   Mariella Saa, PharmD, [place], [place] [facility] Pharmacist [phone]  09/04/2019 11:46 AM

## 2019-09-05 ENCOUNTER — Other Ambulatory Visit: Payer: Self-pay

## 2019-09-05 ENCOUNTER — Ambulatory Visit (INDEPENDENT_AMBULATORY_CARE_PROVIDER_SITE_OTHER): Payer: Medicaid Other | Admitting: *Deleted

## 2019-09-05 VITALS — BP 115/78 | HR 83

## 2019-09-05 DIAGNOSIS — M0579 Rheumatoid arthritis with rheumatoid factor of multiple sites without organ or systems involvement: Secondary | ICD-10-CM

## 2019-09-05 MED ORDER — CERTOLIZUMAB PEGOL 2 X 200 MG/ML ~~LOC~~ KIT
400.0000 mg | PACK | Freq: Once | SUBCUTANEOUS | Status: AC
  Administered 2019-09-05: 400 mg via SUBCUTANEOUS

## 2019-09-05 NOTE — Progress Notes (Signed)
Patient in office for Cimzia injection. Patient denies fever, infection and use of antibiotics. Patient given injection in right and left lower abdomen. Patient tolerated injection well. Patient will return in 1 month for next injection and labs.   Administrations This Visit    Certolizumab Pegol KIT 400 mg    Admin Date 09/05/2019 Action Given Dose 400 mg Route Subcutaneous Administered By Carole Binning, LPN

## 2019-09-12 ENCOUNTER — Other Ambulatory Visit: Payer: Self-pay | Admitting: Physician Assistant

## 2019-09-12 NOTE — Telephone Encounter (Signed)
Last Visit: 07/16/19 Next Visit: 08/05/19 Labs: 7/9/20LFTs and kidney function are WNL.Rest of labs are stable.  Okay to refill per Dr. Estanislado Pandy

## 2019-09-24 ENCOUNTER — Other Ambulatory Visit: Payer: Self-pay | Admitting: Rheumatology

## 2019-09-24 DIAGNOSIS — M0579 Rheumatoid arthritis with rheumatoid factor of multiple sites without organ or systems involvement: Secondary | ICD-10-CM

## 2019-09-24 NOTE — Telephone Encounter (Signed)
Last Visit: 07/16/19 Next Visit: 12/17/19 Labs: 06/26/19 LFTs and kidney function are WNL. Rest of labs are stable. Calcium is chronically low  Tb Gold: 07/07/19 Neg   Okay to refill per Dr. Estanislado Pandy

## 2019-10-01 MED FILL — CIMZIA 200 MG/ML SYRN KIT: 2 X 200 | 28 days supply | Qty: 1 | Fill #0

## 2019-10-03 ENCOUNTER — Ambulatory Visit: Payer: Medicaid Other

## 2019-10-06 ENCOUNTER — Ambulatory Visit (INDEPENDENT_AMBULATORY_CARE_PROVIDER_SITE_OTHER): Payer: Medicaid Other | Admitting: *Deleted

## 2019-10-06 ENCOUNTER — Other Ambulatory Visit: Payer: Self-pay

## 2019-10-06 VITALS — BP 154/83 | HR 94

## 2019-10-06 DIAGNOSIS — M0579 Rheumatoid arthritis with rheumatoid factor of multiple sites without organ or systems involvement: Secondary | ICD-10-CM

## 2019-10-06 DIAGNOSIS — Z79899 Other long term (current) drug therapy: Secondary | ICD-10-CM

## 2019-10-06 MED ORDER — CERTOLIZUMAB PEGOL 2 X 200 MG/ML ~~LOC~~ KIT
400.0000 mg | PACK | Freq: Once | SUBCUTANEOUS | Status: AC
  Administered 2019-10-06: 400 mg via SUBCUTANEOUS

## 2019-10-06 NOTE — Progress Notes (Signed)
Patient in office for a Cimzia injection. Patient denies fever, infection or use of antibiotics. Patient was given injection in right and left lower abdomen. Patient tolerated injection well. Patient will have labs done while in office today. Patient will return in one month for next injection.   Administrations This Visit    Certolizumab Pegol KIT 400 mg    Admin Date 10/06/2019 Action Given Dose 400 mg Route Subcutaneous Administered By Carole Binning, LPN

## 2019-10-07 LAB — CBC WITH DIFFERENTIAL/PLATELET
Absolute Monocytes: 415 cells/uL (ref 200–950)
Basophils Absolute: 48 cells/uL (ref 0–200)
Basophils Relative: 0.7 %
Eosinophils Absolute: 857 cells/uL — ABNORMAL HIGH (ref 15–500)
Eosinophils Relative: 12.6 %
HCT: 37.6 % (ref 35.0–45.0)
Hemoglobin: 12.7 g/dL (ref 11.7–15.5)
Lymphs Abs: 1496 cells/uL (ref 850–3900)
MCH: 32.2 pg (ref 27.0–33.0)
MCHC: 33.8 g/dL (ref 32.0–36.0)
MCV: 95.4 fL (ref 80.0–100.0)
MPV: 11.9 fL (ref 7.5–12.5)
Monocytes Relative: 6.1 %
Neutro Abs: 3985 cells/uL (ref 1500–7800)
Neutrophils Relative %: 58.6 %
Platelets: 241 10*3/uL (ref 140–400)
RBC: 3.94 10*6/uL (ref 3.80–5.10)
RDW: 12.8 % (ref 11.0–15.0)
Total Lymphocyte: 22 %
WBC: 6.8 10*3/uL (ref 3.8–10.8)

## 2019-10-07 LAB — COMPLETE METABOLIC PANEL WITH GFR
AG Ratio: 2.1 (calc) (ref 1.0–2.5)
ALT: 7 U/L (ref 6–29)
AST: 12 U/L (ref 10–30)
Albumin: 3.9 g/dL (ref 3.6–5.1)
Alkaline phosphatase (APISO): 46 U/L (ref 31–125)
BUN: 14 mg/dL (ref 7–25)
CO2: 25 mmol/L (ref 20–32)
Calcium: 8.8 mg/dL (ref 8.6–10.2)
Chloride: 108 mmol/L (ref 98–110)
Creat: 0.75 mg/dL (ref 0.50–1.10)
GFR, Est African American: 121 mL/min/{1.73_m2} (ref >=60)
GFR, Est Non African American: 105 mL/min/{1.73_m2} (ref >=60)
Globulin: 1.9 g/dL (calc) (ref 1.9–3.7)
Glucose, Bld: 104 mg/dL — ABNORMAL HIGH (ref 65–99)
Potassium: 4 mmol/L (ref 3.5–5.3)
Sodium: 142 mmol/L (ref 135–146)
Total Bilirubin: 0.3 mg/dL (ref 0.2–1.2)
Total Protein: 5.8 g/dL — ABNORMAL LOW (ref 6.1–8.1)

## 2019-10-07 NOTE — Progress Notes (Signed)
Labs are stable.

## 2019-10-29 ENCOUNTER — Other Ambulatory Visit: Payer: Self-pay | Admitting: Rheumatology

## 2019-10-29 DIAGNOSIS — M0579 Rheumatoid arthritis with rheumatoid factor of multiple sites without organ or systems involvement: Secondary | ICD-10-CM

## 2019-10-29 MED FILL — CIMZIA 200 MG/ML SYRN KIT: 2 X 200 | 28 days supply | Qty: 1 | Fill #0

## 2019-10-29 NOTE — Telephone Encounter (Signed)
Last Visit: 07/16/19 Next Visit: 12/17/19 Labs: 10/06/19 stable  TB Gold: 07/07/19 Neg   Okay to refill per Dr. Estanislado Pandy

## 2019-11-03 ENCOUNTER — Ambulatory Visit: Payer: Medicaid Other

## 2019-11-19 ENCOUNTER — Ambulatory Visit: Payer: Medicaid Other

## 2019-11-21 ENCOUNTER — Ambulatory Visit: Payer: Medicaid Other | Admitting: Family Medicine

## 2019-11-21 ENCOUNTER — Ambulatory Visit (INDEPENDENT_AMBULATORY_CARE_PROVIDER_SITE_OTHER): Payer: Medicaid Other

## 2019-11-21 ENCOUNTER — Other Ambulatory Visit: Payer: Self-pay

## 2019-11-21 VITALS — BP 113/78 | HR 97

## 2019-11-21 DIAGNOSIS — M0579 Rheumatoid arthritis with rheumatoid factor of multiple sites without organ or systems involvement: Secondary | ICD-10-CM

## 2019-11-21 MED ORDER — PREDNISONE 5 MG PO TABS: ORAL_TABLET | ORAL | 0 refills | Status: DC

## 2019-11-21 MED ORDER — CERTOLIZUMAB PEGOL 2 X 200 MG/ML ~~LOC~~ KIT
400.0000 mg | PACK | Freq: Once | SUBCUTANEOUS | Status: AC
  Administered 2019-11-21: 400 mg via SUBCUTANEOUS

## 2019-11-21 NOTE — Progress Notes (Signed)
Patient presents in office for a Cimzia injection. Patient denies infections/sickness, fevers or use of antibiotics. Patient was given injection in right and left lower abdomen. Patient tolerated injections well. Patient will return to the office in 1 month for next injection.   Administrations This Visit    Certolizumab Pegol KIT 400 mg    Admin Date 11/21/2019 Action Given Dose 400 mg Route Subcutaneous Administered By Earnestine Mealing, CMA         Patient states she is having right shoulder pain and believes the pain is due to being 2 weeks overdue for Cimzia injection. Patient requested prednisone taper. Per Dr. Estanislado Pandy, prednisone taper 70m taper by 580mevery 2 days. Patient also complains of tinnitus in left ear for the past week, discussed with Dr. DeEstanislado Pandynd advised patient to contact her PCP.

## 2019-11-25 ENCOUNTER — Other Ambulatory Visit: Payer: Self-pay

## 2019-11-25 ENCOUNTER — Encounter: Payer: Self-pay | Admitting: Family Medicine

## 2019-11-25 ENCOUNTER — Ambulatory Visit (INDEPENDENT_AMBULATORY_CARE_PROVIDER_SITE_OTHER): Payer: Medicaid Other | Admitting: Family Medicine

## 2019-11-25 VITALS — BP 118/68 | HR 106

## 2019-11-25 DIAGNOSIS — H9312 Tinnitus, left ear: Secondary | ICD-10-CM

## 2019-11-25 NOTE — Assessment & Plan Note (Addendum)
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  Exam with normal TMs and canals bilaterally without obvious evidence of infection, tumor, or trauma despite chronic immunosuppressive therapy for RA.  Will refer to ENT for more formal audiology testing and extensive evaluation.  Return precautions discussed, see AVS for details.

## 2019-11-25 NOTE — Progress Notes (Signed)
  Subjective:   Patient ID: Hayley Burke    DOB: 11-17-1986, 33 y.o. female   MRN: [id number]  Hayley Burke is a 33 y.o. female with a history of rheumatoid arthritis, pernicious anemia here for   Ear ringing Endorses ringing of her left ear for the past 8 days.  Has noted "less clarity" and some hearing loss for the past 7 years.  She denies any pain but does note occasional pressure especially with positional changes.  She has not tried any medications for this.  Denies recent trauma or surgeries.  Denies antibiotic use in the last month.  She does not have diabetes.  Of note she was recently given a steroid taper by rheumatology given she is overdue for her Cimzia injection.  Denies discharge, fever, pain with chewing, dizziness, rashes or blisters, weight loss.  Review of Systems:  Per HPI.  Medications and smoking status reviewed.  Objective:   BP 118/68   Pulse (!) 106   SpO2 99%   Breastfeeding No  Vitals and nursing note reviewed.  General: well nourished, well developed, in no acute distress with non-toxic appearance HEENT: normocephalic, atraumatic, moist mucous membranes.  TMs clear bilaterally without purulence, bulging, erythema.  Canals clear bilaterally.  No pain with tugging of pinna. 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  Exam with normal TMs and canals bilaterally without obvious evidence of infection, tumor, or trauma despite chronic immunosuppressive therapy for RA.  Will refer to ENT for more formal audiology testing and extensive evaluation.  Return precautions discussed, see AVS for details.  Orders Placed This Encounter  Procedures  . Ambulatory referral to ENT    Referral Priority:   Routine    Referral Type:   Consultation    Referral Reason:   Specialty Services Required    Requested  Specialty:   Otolaryngology    Number of Visits Requested:   1   No orders of the defined types were placed in this encounter.   Rory Percy, DO PGY-3, [facility] 11/25/2019 11:34 AM

## 2019-11-25 NOTE — Patient Instructions (Addendum)
It was great to see you!  Our plans for today:  - We are referring you to the Ear, Nose, and Throat doctors. They will do a more extensive evaluation and hearing testing. If you have not heard about an appointment in about 2 weeks, let us know. - If you develop fever, intense pain or discharge, come back to see [place].  Take care and seek immediate care sooner if you develop any concerns.   Dr. [facility]   Tinnitus Tinnitus refers to hearing a sound when there is no actual source for that sound. This is often described as ringing in the ears. However, people with this condition may hear a variety of noises, in one ear or in both ears. The sounds of tinnitus can be soft, loud, or somewhere in between. Tinnitus can last for a few seconds or can be constant for days. It may go away without treatment and come back at various times. When tinnitus is constant or happens often, it can lead to other problems, such as trouble sleeping and trouble concentrating. Almost everyone experiences tinnitus at some point. Tinnitus that is long-lasting (chronic) or comes back often (recurs) may require medical attention. What are the causes? The cause of tinnitus is often not known. In some cases, it can result from other problems or conditions, including:  Exposure to loud noises from machinery, music, or other sources.  Hearing loss.  Ear or sinus infections.  Earwax buildup.  An object (foreign body) stuck in the ear.  Taking certain medicines.  Drinking alcohol or caffeine.  High blood pressure.  Heart diseases.  Anemia.  Allergies.  Meniere's disease.  Thyroid problems.  Tumors.  A weak, bulging blood vessel (aneurysm) near the ear.  Depression or other mood disorders. What are the signs or symptoms? The main symptom of tinnitus is hearing a sound when there is no source for that sound. It may sound like:  Buzzing.  Roaring.  Ringing.  Blowing air, like the  sound heard when you listen to a seashell.  Hissing.  Whistling.  Sizzling.  Humming.  Running water.  A musical note.  Tapping. Symptoms may affect only one ear (unilateral) or both ears (bilateral). How is this diagnosed? Tinnitus is diagnosed based on your symptoms, your medical history, and a physical exam. Your health care provider may do a thorough hearing test (audiologic exam) if your tinnitus:  Is unilateral.  Causes hearing difficulties.  Lasts 6 months or longer. You may work with a health care provider who specializes in hearing disorders (audiologist). You may be asked questions about your symptoms and how they affect your daily life. You may have other tests done, such as:  CT scan.  MRI.  An imaging test of how blood flows through your blood vessels (angiogram). How is this treated? Treating an underlying medical condition can sometimes make tinnitus go away. If your tinnitus continues, other treatments may include:  Medicines, such as antidepressants or sleeping aids.  Sound generators to mask the tinnitus. These include: ? Tabletop sound machines that play relaxing sounds to help you fall asleep. ? Wearable devices that fit in your ear and play sounds or music. ? Acoustic neural stimulation. This involves using headphones to listen to music that contains an auditory signal. Over time, listening to this signal may change some pathways in your brain and make you less sensitive to tinnitus. This treatment is used for very severe cases when no other treatment is working.  Therapy  and counseling to help you manage the stress of living with tinnitus.  Using hearing aids or cochlear implants if your tinnitus is related to hearing loss. Hearing aids are worn in the outer ear. Cochlear implants are surgically placed in the inner ear. Follow these instructions at home: Managing symptoms      When possible, avoid being in loud places and being exposed to loud  sounds.  Wear hearing protection, such as earplugs, when you are exposed to loud noises.  Use a white noise machine, a humidifier, or other devices to mask the sound of tinnitus.  Practice techniques for reducing stress, such as meditation, yoga, or deep breathing. Work with your health care provider if you need help with managing stress.  Sleep with your head slightly raised. This may reduce the impact of tinnitus. General instructions  Do not use stimulants, such as nicotine, alcohol, or caffeine. Talk with your health care provider about other stimulants to avoid. Stimulants are substances that can make you feel alert and attentive by increasing certain activities in the body (such as heart rate and blood pressure). These substances may make tinnitus worse.  Take over-the-counter and prescription medicines only as told by your health care provider.  Try to get plenty of sleep each night.  Keep all follow-up visits as told by your health care provider. This is important. Contact a health care provider if:  Your tinnitus continues for 3 weeks or longer without stopping.  Your symptoms get worse or do not get better with home care.  You develop tinnitus after a head injury.  You have tinnitus along with any of the following: ? Dizziness. ? Loss of balance. ? Nausea and vomiting. Summary  Tinnitus refers to hearing a sound when there is no actual source for that sound. This is often described as ringing in the ears.  Symptoms may affect only one ear (unilateral) or both ears (bilateral).  Use a white noise machine, a humidifier, or other devices to mask the sound of tinnitus.  Do not use stimulants, such as nicotine, alcohol, or caffeine. Talk with your health care provider about other stimulants to avoid. These substances may make tinnitus worse. This information is not intended to replace advice given to you by your health care provider. Make sure you discuss any questions you  have with your health care provider. Document Released: 12/04/2005 Document Revised: 11/16/2017 Document Reviewed: 09/13/2017 Elsevier Patient Education  2020 [employer].

## 2019-11-27 ENCOUNTER — Other Ambulatory Visit: Payer: Self-pay | Admitting: Rheumatology

## 2019-11-27 DIAGNOSIS — M0579 Rheumatoid arthritis with rheumatoid factor of multiple sites without organ or systems involvement: Secondary | ICD-10-CM

## 2019-11-27 NOTE — Telephone Encounter (Signed)
Last Visit: 07/16/2019 Next Visit: 12/17/2019 Labs: 10/06/2019 Labs are stable TB Gold: 07/07/2019 negative   Okay to refill per Dr. Estanislado Pandy.

## 2019-11-28 ENCOUNTER — Telehealth: Payer: Self-pay | Admitting: Hematology

## 2019-11-28 NOTE — Telephone Encounter (Signed)
[facility] PAL 12/24 moved lab/fu to 1/15. confirmed with patient,

## 2019-12-10 MED FILL — CIMZIA 200 MG/ML SYRN KIT: 2 X 200 | 28 days supply | Qty: 1 | Fill #0

## 2019-12-11 ENCOUNTER — Other Ambulatory Visit: Payer: Medicaid Other

## 2019-12-11 ENCOUNTER — Ambulatory Visit: Payer: Medicaid Other | Admitting: Hematology

## 2019-12-15 ENCOUNTER — Other Ambulatory Visit: Payer: Self-pay | Admitting: Rheumatology

## 2019-12-15 NOTE — Telephone Encounter (Signed)
Last Visit: 07/16/2019 Next Visit: 12/22/2019 Labs: 10/06/2019 Labs are stable  Okay to refill per Dr. Estanislado Pandy.

## 2019-12-17 ENCOUNTER — Ambulatory Visit: Payer: Medicaid Other | Admitting: Physician Assistant

## 2019-12-22 ENCOUNTER — Ambulatory Visit: Payer: Medicaid Other | Admitting: Rheumatology

## 2019-12-30 ENCOUNTER — Ambulatory Visit: Payer: Medicaid Other

## 2019-12-30 ENCOUNTER — Telehealth: Payer: Self-pay | Admitting: Rheumatology

## 2019-12-30 NOTE — Telephone Encounter (Signed)
Patient left a voicemail stating she "forgot that she had orientation for her clinical today" and needs to reschedule her appointment.  Patient states she is available on Thursday, 01/01/20 at 9:00 am or any time before 12:00 pm or on Friday, 01/02/20 before 11:00 am.

## 2019-12-31 NOTE — Telephone Encounter (Signed)
Attempted to contact the patient and left message for patient to call the office.  

## 2020-01-02 ENCOUNTER — Inpatient Hospital Stay: Payer: Medicaid Other

## 2020-01-02 ENCOUNTER — Inpatient Hospital Stay: Payer: Medicaid Other | Admitting: Hematology

## 2020-01-05 ENCOUNTER — Telehealth: Payer: Self-pay | Admitting: Hematology

## 2020-01-05 NOTE — Telephone Encounter (Signed)
Scheduled appt per 1/15 sch message - pt is aware of appt date and time   

## 2020-01-07 ENCOUNTER — Telehealth: Payer: Self-pay | Admitting: Rheumatology

## 2020-01-07 NOTE — Telephone Encounter (Signed)
Patient called stating she has Robocall and our office number was blocked and didn't receive the call.  Patient states she is available tomorrow, 01/08/20 between 8-12 pm for Cimzia appointment.  Patient states until she is able to fix her phone, please call her husband Weston Brass at  #[phone] or email her at [email or web address]

## 2020-01-07 NOTE — Telephone Encounter (Signed)
Patient's husband advised she will need an office visit prior to Cimzia injection. Patient has been scheduled for 01/08/20 at 11:15 am.

## 2020-01-07 NOTE — Progress Notes (Signed)
Office Visit Note  Patient: Hayley Burke             Date of Birth: October 28, 1986           MRN: [id number]             PCP: Rory Percy, DO Referring: Rory Percy, DO Visit Date: 01/08/2020 Occupation: '@GUAROCC'$ @  Subjective:  Medication monitoring   History of Present Illness: Evgenia Merriman is a 34 y.o. female with history of seropositive rheumatoid arthritis flares.  She is on Cimzia 400 mg sq injections every 4 weeks, MTX 8 tablets po once weekly, and folic acid 2 mg po daily.  She has not missed any doses recently.  She denies any recent rheumatoid arthritis flares.  She denies any joint pain or joint swelling currently.  She denies any morning stiffness.  She remains active and uses her hands a lot on a daily basis typing, writing, and working.   She is planning on receiving the covid-19 vaccine.    Activities of Daily Living:  Patient reports morning stiffness for 0 minutes.   Patient Denies nocturnal pain.  Difficulty dressing/grooming: Denies Difficulty climbing stairs: Denies Difficulty getting out of chair: Denies Difficulty using hands for taps, buttons, cutlery, and/or writing: Denies  Review of Systems  Constitutional: Negative for fatigue.  HENT: Positive for mouth dryness. Negative for mouth sores and nose dryness.   Eyes: Positive for dryness. Negative for pain and visual disturbance.  Respiratory: Negative for cough, hemoptysis, shortness of breath and difficulty breathing.   Cardiovascular: Negative for chest pain, palpitations, hypertension and swelling in legs/feet.  Gastrointestinal: Negative for blood in stool, constipation and diarrhea.  Endocrine: Negative for increased urination.  Genitourinary: Negative for painful urination.  Musculoskeletal: Negative for arthralgias, joint pain, joint swelling, myalgias, muscle weakness, morning stiffness, muscle tenderness and myalgias.  Skin: Negative for color change, pallor, rash, hair loss, nodules/bumps, skin  tightness, ulcers and sensitivity to sunlight.  Allergic/Immunologic: Negative for susceptible to infections.  Neurological: Positive for headaches. Negative for dizziness, numbness and weakness.  Hematological: Negative for swollen glands.  Psychiatric/Behavioral: Negative for depressed mood and sleep disturbance. The patient is not nervous/anxious.     PMFS History:  Patient Active Problem List   Diagnosis Date Noted  . 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 IUD (intrauterine device) in place 07/07/2019  . Pernicious anemia 03/20/2018  . Rheumatoid arthritis with rheumatoid factor of multiple sites without organ or systems involvement ([place]) 05/03/2017  . High risk medications (not anticoagulants) long-term use 05/03/2017  . Family history of breast cancer 01/19/2016    Past Medical History:  Diagnosis Date  . Rheumatoid arthritis ([place])     Family History  Problem Relation Age of Onset  . Sjogren's syndrome Mother   . Breast cancer Mother   . Depression Mother   . Lung cancer Father   . Hyperlipidemia Father   . Drug abuse Brother   . Heart attack Paternal Grandfather    Past Surgical History:  Procedure Laterality Date  . CESAREAN SECTION    . TONSILLECTOMY AND ADENOIDECTOMY     Social History   Social History Narrative  . Not on file   Immunization History  Administered Date(s) Administered  . Influenza,inj,Quad PF,6+ Mos 11/07/2018  . Pneumococcal Polysaccharide-23 01/06/2016  . Tdap 05/12/2018     Objective: Vital Signs: BP 123/84 (BP Location: Left Arm, Patient Position: Sitting, Cuff Size: Normal)   Pulse (!) 106   Resp 13   Ht  $'5\' 5"'B$  (1.651 m)   Wt 134 lb 3.2 oz (60.9 kg)   BMI 22.33 kg/m    Physical Exam Vitals and nursing note reviewed.  Constitutional:      Appearance: She is well-developed.  HENT:     Head: Normocephalic and atraumatic.  Eyes:     Conjunctiva/sclera: Conjunctivae normal.  Cardiovascular:     Rate and Rhythm: Normal rate and  regular rhythm.     Heart sounds: Normal heart sounds.  Pulmonary:     Effort: Pulmonary effort is normal.     Breath sounds: Normal breath sounds.  Abdominal:     General: Bowel sounds are normal.     Palpations: Abdomen is soft.  Musculoskeletal:     Cervical back: Normal range of motion.  Lymphadenopathy:     Cervical: No cervical adenopathy.  Skin:    General: Skin is warm and dry.     Capillary Refill: Capillary refill takes less than 2 seconds.  Neurological:     Mental Status: She is alert and oriented to person, place, and time.  Psychiatric:        Behavior: Behavior normal.      Musculoskeletal Exam: C-spine, thoracic spine, and lumbar spine good ROM.  No midline spinal tenderness.  No SI joint tenderness. Shoulder joints, elbow joints, MCPs, PIPs, and DIPs good ROM with no synovitis.  Limited ROM of both wrist joints.  Mild DIP synovial thickening.  Hip joints, knee joints, ankle joints, MTPs, PIPs, and DIPs good ROM with no synovitis.  No warmth or effusion of knee joints.  No tenderness or swelling of ankle joints.  No tenderness over trochanteric bursa bilaterally.    CDAI Exam: CDAI Score: 0  Patient Global: 0 mm; Provider Global: 0 mm Swollen: 0 ; Tender: 0  Joint Exam 01/08/2020   No joint exam has been documented for this visit   There is currently no information documented on the homunculus. Go to the Rheumatology activity and complete the homunculus joint exam.  Investigation: No additional findings.  Imaging: XR Foot 2 Views Left  Result Date: 01/08/2020 PIP and DIP narrowing was noted.  Juxta-articular osteopenia was noted.  Erosive changes were noted in the fifth MTP joint.  No intertarsal or tibiotalar joint space narrowing was noted.  These x-rays were compared from the previous films of May 30, 2017.  There was no radiographic progression. Impression: X-rays were consistent with osteoarthritis and rheumatoid arthritis with erosive disease.  No  radiographic progression was noted.  XR Foot 2 Views Right  Result Date: 01/08/2020 First MTP, PIP and DIP narrowing was noted.  Juxta-articular osteopenia was noted.  Fifth MTP erosive changes were noted.  No intertarsal tibiotalar joint space narrowing was noted.  No interval change was noted from the x-rays of May 30, 2017. 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  No radiographic progression was noted.  XR Hand 2 View Left  Result Date: 01/08/2020 Severe first and third MCP joint narrowing was noted.  All MCP joint narrowing with juxta-articular osteopenia was noted.  Severe intercarpal, radiocarpal and metacarpal carpal joint space narrowing was noted.  Erosive changes were noted at the base of the first metacarpal.  Erosive changes were noted in all the carpal bones, radius and ulnar styloid.  No radiographic progression was noted from the x-rays obtained on May 30, 2017. Impression: Severe erosive rheumatoid arthritis with no radiographic progression compared to May 30, 2017.  XR Hand 2 View Right  Result Date: 01/08/2020 Juxta-articular osteopenia was noted.  PIP and DIP narrowing was noted.  Subluxation of first MCP joint with erosive changes was noted.  Severe intercarpal and radiocarpal joint space narrowing was noted.  Erosive changes were noted in the carpal bones and also in the radius.  Ulnar styloid erosion was noted.  No radiographic progression was noted from the previous film of May 30, 2017. Impression: These findings are consistent with severe erosive rheumatoid arthritis.  No radiographic progression was noted.   Recent Labs: Lab Results  Component Value Date   WBC 6.8 10/06/2019   HGB 12.7 10/06/2019   PLT 241 10/06/2019   NA 142 10/06/2019   K 4.0 10/06/2019   CL 108 10/06/2019   CO2 25 10/06/2019   GLUCOSE 104 (H) 10/06/2019   BUN 14 10/06/2019   CREATININE 0.75 10/06/2019   BILITOT 0.3 10/06/2019   ALKPHOS 52  06/10/2019   AST 12 10/06/2019   ALT 7 10/06/2019   PROT 5.8 (L) 10/06/2019   ALBUMIN 3.2 (L) 06/10/2019   CALCIUM 8.8 10/06/2019   GFRAA 121 10/06/2019   QFTBGOLDPLUS Negative 07/07/2019    Speciality Comments: No specialty comments available.  Procedures:  No procedures performed Allergies: Sulfa antibiotics   Assessment / Plan:     Visit Diagnoses: Rheumatoid arthritis with rheumatoid factor of multiple sites without organ or systems involvement ([place]) - She has no synovitis or tenderness on exam.  She has not had any recent rheumatoid arthritis flares.  She is clinically doing well on Cimzia 400 mg sq injections every 28 days, Methotrexate 8 tablets po once weekly, and folic acid 2 mg po daily.  She has not missed any doses recently.  She had an inadequate response to  Humira, Enbrel, Orencia and Kevzara in the past.  She will continue on the current treatment regimen.  We will update x-rays of both hands and both feet to assess for interval change since 05/30/17.  She was advised to notify us if she develops increased joint pain or joint swelling.  She will follow up in 5 months. Plan: Certolizumab Pegol KIT 400 mg, XR Hand 2 View Right, XR Hand 2 View Left, XR Foot 2 Views Right, XR Foot 2 Views Left.  X-rays reveal severe erosive rheumatoid arthritis with no radiographic progression when compared with the films of May 30, 2017.  High risk medication use - Cimzia 400 mg every 28 days in office started on 08/21/18, methotrexate 2.5 mg 8 tablets every 7 days, and folic acid 1 mg 2 tablets daily.  Last TB gold negative on 07/07/19.  CBC and CMP were drawn on 10/06/19.  She is due to update lab work and is planning on having lab work drawn tomorrow. Standing orders are in place.    Vitamin D deficiency: She is taking a vitamin D supplement.    Other medical conditions are listed as follows:   History of anemia  Folliculitis  Orders: Orders Placed This Encounter  Procedures  . XR Hand 2  View Right  . XR Hand 2 View Left  . XR Foot 2 Views Right  . XR Foot 2 Views Left   Meds ordered this encounter  Medications  . Certolizumab Pegol KIT 400 mg    Face-to-face time spent with patient was 30 minutes. Greater than 50% of time was spent in counseling and coordination of care.  Follow-Up Instructions: Return in about 5 months (around 06/07/2020) for Rheumatoid arthritis.   Bo Merino, MD  Scribed by-  Hazel Sams, PA-C  Note - This record has been created using Dragon software.  Chart creation errors have been sought, but may not always  have been located. Such creation errors do not reflect on  the standard of medical care.

## 2020-01-08 ENCOUNTER — Other Ambulatory Visit: Payer: Self-pay

## 2020-01-08 ENCOUNTER — Ambulatory Visit: Payer: Self-pay

## 2020-01-08 ENCOUNTER — Ambulatory Visit (INDEPENDENT_AMBULATORY_CARE_PROVIDER_SITE_OTHER): Payer: Medicaid Other

## 2020-01-08 ENCOUNTER — Ambulatory Visit (INDEPENDENT_AMBULATORY_CARE_PROVIDER_SITE_OTHER): Payer: Medicaid Other | Admitting: Rheumatology

## 2020-01-08 ENCOUNTER — Encounter: Payer: Self-pay | Admitting: Rheumatology

## 2020-01-08 VITALS — BP 123/84 | HR 106 | Resp 13 | Ht 65.0 in | Wt 134.2 lb

## 2020-01-08 DIAGNOSIS — E559 Vitamin D deficiency, unspecified: Secondary | ICD-10-CM | POA: Diagnosis not present

## 2020-01-08 DIAGNOSIS — Z862 Personal history of diseases of the blood and blood-forming organs and certain disorders involving the immune mechanism: Secondary | ICD-10-CM | POA: Diagnosis not present

## 2020-01-08 DIAGNOSIS — M79671 Pain in right foot: Secondary | ICD-10-CM | POA: Diagnosis not present

## 2020-01-08 DIAGNOSIS — Z79899 Other long term (current) drug therapy: Secondary | ICD-10-CM | POA: Diagnosis not present

## 2020-01-08 DIAGNOSIS — M79642 Pain in left hand: Secondary | ICD-10-CM

## 2020-01-08 DIAGNOSIS — M0579 Rheumatoid arthritis with rheumatoid factor of multiple sites without organ or systems involvement: Secondary | ICD-10-CM | POA: Diagnosis not present

## 2020-01-08 DIAGNOSIS — M79672 Pain in left foot: Secondary | ICD-10-CM

## 2020-01-08 DIAGNOSIS — L739 Follicular disorder, unspecified: Secondary | ICD-10-CM

## 2020-01-08 DIAGNOSIS — M79641 Pain in right hand: Secondary | ICD-10-CM

## 2020-01-08 MED ORDER — CERTOLIZUMAB PEGOL 2 X 200 MG/ML ~~LOC~~ KIT
400.0000 mg | PACK | Freq: Once | SUBCUTANEOUS | Status: AC
  Administered 2020-01-08: 400 mg via SUBCUTANEOUS

## 2020-01-09 ENCOUNTER — Other Ambulatory Visit: Payer: Self-pay | Admitting: Rheumatology

## 2020-01-09 DIAGNOSIS — M0579 Rheumatoid arthritis with rheumatoid factor of multiple sites without organ or systems involvement: Secondary | ICD-10-CM

## 2020-01-09 NOTE — Telephone Encounter (Signed)
Last Visit: 01/08/20 Next Visit: follow up due June 2021 Labs: 10/06/19 stable  TB Gold: 07/07/19 neg   Okay to refill per Dr. Corliss Skains

## 2020-01-16 ENCOUNTER — Other Ambulatory Visit: Payer: Self-pay | Admitting: Otolaryngology

## 2020-01-16 DIAGNOSIS — H9042 Sensorineural hearing loss, unilateral, left ear, with unrestricted hearing on the contralateral side: Secondary | ICD-10-CM

## 2020-01-16 DIAGNOSIS — IMO0001 Reserved for inherently not codable concepts without codable children: Secondary | ICD-10-CM

## 2020-01-30 ENCOUNTER — Other Ambulatory Visit: Payer: Self-pay

## 2020-01-30 ENCOUNTER — Inpatient Hospital Stay: Payer: Medicaid Other

## 2020-01-30 ENCOUNTER — Inpatient Hospital Stay: Payer: Medicaid Other | Attending: Hematology | Admitting: Hematology

## 2020-01-30 ENCOUNTER — Other Ambulatory Visit: Payer: Self-pay | Admitting: Hematology

## 2020-01-30 VITALS — BP 144/86 | HR 91 | Temp 98.2°F | Resp 18 | Wt 134.4 lb

## 2020-01-30 DIAGNOSIS — Z803 Family history of malignant neoplasm of breast: Secondary | ICD-10-CM | POA: Insufficient documentation

## 2020-01-30 DIAGNOSIS — Z801 Family history of malignant neoplasm of trachea, bronchus and lung: Secondary | ICD-10-CM | POA: Diagnosis not present

## 2020-01-30 DIAGNOSIS — Z87891 Personal history of nicotine dependence: Secondary | ICD-10-CM | POA: Insufficient documentation

## 2020-01-30 DIAGNOSIS — M8580 Other specified disorders of bone density and structure, unspecified site: Secondary | ICD-10-CM | POA: Insufficient documentation

## 2020-01-30 DIAGNOSIS — D510 Vitamin B12 deficiency anemia due to intrinsic factor deficiency: Secondary | ICD-10-CM

## 2020-01-30 DIAGNOSIS — M069 Rheumatoid arthritis, unspecified: Secondary | ICD-10-CM | POA: Insufficient documentation

## 2020-01-30 DIAGNOSIS — D509 Iron deficiency anemia, unspecified: Secondary | ICD-10-CM | POA: Insufficient documentation

## 2020-01-30 LAB — CMP (CANCER CENTER ONLY)
ALT: 19 U/L (ref 0–44)
AST: 18 U/L (ref 15–41)
Albumin: 4.1 g/dL (ref 3.5–5.0)
Alkaline Phosphatase: 52 U/L (ref 38–126)
Anion gap: 8 (ref 5–15)
BUN: 19 mg/dL (ref 6–20)
CO2: 26 mmol/L (ref 22–32)
Calcium: 8.5 mg/dL — ABNORMAL LOW (ref 8.9–10.3)
Chloride: 107 mmol/L (ref 98–111)
Creatinine: 0.73 mg/dL (ref 0.44–1.00)
GFR, Est AFR Am: >60 mL/min (ref >60)
GFR, Estimated: >60 mL/min (ref >60)
Glucose, Bld: 92 mg/dL (ref 70–99)
Potassium: 3.8 mmol/L (ref 3.5–5.1)
Sodium: 141 mmol/L (ref 135–145)
Total Bilirubin: 0.2 mg/dL — ABNORMAL LOW (ref 0.3–1.2)
Total Protein: 6.5 g/dL (ref 6.5–8.1)

## 2020-01-30 LAB — CBC WITH DIFFERENTIAL/PLATELET
Abs Immature Granulocytes: 0.01 10*3/uL (ref 0.00–0.07)
Basophils Absolute: 0 10*3/uL (ref 0.0–0.1)
Basophils Relative: 1 %
Eosinophils Absolute: 0.6 10*3/uL — ABNORMAL HIGH (ref 0.0–0.5)
Eosinophils Relative: 10 %
HCT: 40 % (ref 36.0–46.0)
Hemoglobin: 13.2 g/dL (ref 12.0–15.0)
Immature Granulocytes: 0 %
Lymphocytes Relative: 21 %
Lymphs Abs: 1.3 10*3/uL (ref 0.7–4.0)
MCH: 32 pg (ref 26.0–34.0)
MCHC: 33 g/dL (ref 30.0–36.0)
MCV: 97.1 fL (ref 80.0–100.0)
Monocytes Absolute: 0.4 10*3/uL (ref 0.1–1.0)
Monocytes Relative: 6 %
Neutro Abs: 4 10*3/uL (ref 1.7–7.7)
Neutrophils Relative %: 62 %
Platelets: 249 10*3/uL (ref 150–400)
RBC: 4.12 MIL/uL (ref 3.87–5.11)
RDW: 12.8 % (ref 11.5–15.5)
WBC: 6.3 10*3/uL (ref 4.0–10.5)
nRBC: 0 % (ref 0.0–0.2)

## 2020-01-30 LAB — FERRITIN: Ferritin: 101 ng/mL (ref 11–307)

## 2020-01-30 LAB — IRON AND TIBC
Iron: 74 ug/dL (ref 41–142)
Saturation Ratios: 31 % (ref 21–57)
TIBC: 243 ug/dL (ref 236–444)
UIBC: 169 ug/dL (ref 120–384)

## 2020-01-30 LAB — VITAMIN B12: Vitamin B-12: 2193 pg/mL — ABNORMAL HIGH (ref 180–914)

## 2020-02-02 ENCOUNTER — Other Ambulatory Visit: Payer: Medicaid Other

## 2020-02-02 MED FILL — CIMZIA 200 MG/ML SYRN KIT: 2 X 200 | 28 days supply | Qty: 1 | Fill #0

## 2020-02-03 ENCOUNTER — Telehealth: Payer: Self-pay | Admitting: Hematology

## 2020-02-03 NOTE — Telephone Encounter (Signed)
Scheduled per 02/12 los, patient has been called and notified. 

## 2020-02-05 NOTE — Progress Notes (Signed)
HEMATOLOGY/ONCOLOGY CLINIC NOTE  Date of Service:  .01/30/2020   Patient Care Team: Rory Percy, DO as PCP - General (Family Medicine)  CHIEF COMPLAINTS/PURPOSE OF CONSULTATION:  Anemia  HISTORY OF PRESENTING ILLNESS:   Hayley Burke is a wonderful 34 y.o. female who has been referred to [place] by Deberah Castle N.P for evaluation and management of anemia. She is accompanied today by her mother and daughter. The pt reports that she is doing well overall.   The pt reports that she sees Dr. Estanislado Pandy for her RA. She notes that she has taken Metholtrexate 0.8 mL for 6 years.  She was first diagnosed with RA 6 years ago, she has also tried Plaquinil,  Humara, and Enbrel in the past. She notes that she has been on biologics since being diagnosed. She takes 52m Folic acid replacement daily. She notes that she receives prednisone during her RA flare ups.   She notes that she first learned of her anemia a year ago. She noticed that her nails were getting more brittle, and that she was more tired.  She notes that she took Ferrous sulfate in the past, during her pregnancy 4 years ago.  She notes that she has ice cravings. She notes that she has not had a period [place]. She notes that she has had normal to slightly heavy before this. She has never had IV iron nor a blood transfusion.   She notes that she takes 2-4 tablets of Ibuprofen daily for 6-7 years for chronic headaches and migraines. She denies any stomach issues and does not take OTC anti acids. She notes that she has no drug allergies besides Sulfa antibiotics which was accompanied by hives.   Most recent lab results (02/22/18) of CBC  is as follows: all values are WNL except for Hgb at 11.0, RDW at 17.6, EOS Abs aty 1.1k. Ferritin 02/22/18 was low at 13. Vitamin B12 02/22/18 was slightly elevated at 1318.  Iron and TIBC 02/22/18 showed Iron low at 15 and Iron Sat low at 5%  On review of systems, pt reports  being tired, occasional mouth sores, and denies blood in the stools, melena, any bleeding, sudden weight loss, fevers, chills, night sweats, noticing any enlarged lymph nodes, abdominal pains, leg swelling and any other symptoms.   On PMHx the pt reports Rheumatoid Arthritis, and anemia. On Surgical Hx the pt reports adenoid surgery, caesarean section with a post-operative hematoma, and tonsilectomy.  On Social Hx the pt denies much ETOH use and quit smoking 5 years ago.  On Family Hx the pt reports her father had lung cancer, her cousins had prostate cancer.  Interval History:   SKharis Lapennareturns today regarding her iron deficiency anemia. The patient's last visit with [place] on 06/10/2019. The pt reports that she is doing well overall.  The pt reports she has been feel at her baseline. No acute new symptoms.  The pt has continued on SL 10066m Vitamin B12. She also continues on folic acid replacement and methotrexate.  Lab results today shows normal hgb of 13.2. ferritin 102, B12 - WNL  On review of systems, pt reports stable energy levels, stable weight, and denies concerns for bleeding, fatigue, blood in the stools, black stools, unexpected weight loss, and any other symptoms.   MEDICAL HISTORY:  Past Medical History:  Diagnosis Date  . Rheumatoid arthritis ([facility]    SURGICAL HISTORY: Past Surgical History:  Procedure Laterality Date  . CESAREAN SECTION    .  TONSILLECTOMY AND ADENOIDECTOMY      SOCIAL HISTORY: Social History   Socioeconomic History  . Marital status: Unknown    Spouse name: Not on file  . Number of children: Not on file  . Years of education: Not on file  . Highest education level: Not on file  Occupational History  . Not on file  Tobacco Use  . Smoking status: Former Smoker    Packs/day: 1.00    Years: 15.00    Pack years: 15.00    Types: Cigarettes    Quit date: 06/26/2012    Years since quitting: 7.6  . Smokeless tobacco: Never Used    Substance and Sexual Activity  . Alcohol use: No  . Drug use: No  . Sexual activity: Yes  Other Topics Concern  . Not on file  Social History Narrative  . Not on file   Social Determinants of Health   Financial Resource Strain:   . Difficulty of Paying Living Expenses: Not on file  Food Insecurity:   . Worried About Charity fundraiser in the Last Year: Not on file  . Ran Out of Food in the Last Year: Not on file  Transportation Needs:   . Lack of Transportation (Medical): Not on file  . Lack of Transportation (Non-Medical): Not on file  Physical Activity:   . Days of Exercise per Week: Not on file  . Minutes of Exercise per Session: Not on file  Stress:   . Feeling of Stress : Not on file  Social Connections:   . Frequency of Communication with Friends and Family: Not on file  . Frequency of Social Gatherings with Friends and Family: Not on file  . Attends Religious Services: Not on file  . Active Member of Clubs or Organizations: Not on file  . Attends Archivist Meetings: Not on file  . Marital Status: Not on file  Intimate Partner Violence:   . Fear of Current or Ex-Partner: Not on file  . Emotionally Abused: Not on file  . Physically Abused: Not on file  . Sexually Abused: Not on file    FAMILY HISTORY: Family History  Problem Relation Age of Onset  . Sjogren's syndrome Mother   . Breast cancer Mother   . Depression Mother   . Lung cancer Father   . Hyperlipidemia Father   . Drug abuse Brother   . Heart attack Paternal Grandfather     ALLERGIES:  is allergic to sulfa antibiotics.  MEDICATIONS:  Current Outpatient Medications  Medication Sig Dispense Refill  . Ascorbic Acid (VITAMIN C) 1000 MG tablet Take 1 tablet (1,000 mg total) by mouth daily.    Marland Kitchen aspirin-acetaminophen-caffeine (EXCEDRIN MIGRAINE) 250-250-65 MG tablet Take 1 tablet by mouth every 6 (six) hours as needed for headache.    . Biotin 10000 MCG TABS Take by mouth.    . Calcium  Carbonate-Vit D-Min (CALCIUM 1200 PO) Take by mouth.    Marland Kitchen CIMZIA PREFILLED 2 X 200 MG/ML KIT INJECT 400 MG INTO THE SKIN EVERY 28 (TWENTY-EIGHT) DAYS. 1 kit 0  . Cyanocobalamin (VITAMIN B-12 PO) Take by mouth daily.    . cycloSPORINE (RESTASIS) 0.05 % ophthalmic emulsion as needed.    . folic acid (FOLVITE) 1 MG tablet Take 2 tablets (2 mg total) by mouth daily. 60 tablet 3  . Levonorgestrel (KYLEENA) 19.5 MG IUD Kyleena 17.5 mcg/24 hour (5 years) intrauterine device  TO BE INSERTED ONE TIME BY PRESCRIBER. ROUTE INTRAUTERINE.    Marland Kitchen  methotrexate (RHEUMATREX) 2.5 MG tablet TAKE 8 TABLETS BY MOUTH ONCE A WEEK PROTECT FROM LIGHT 96 tablet 0  . naproxen (NAPROSYN) 500 MG tablet Take 1 tablet twice a day as needed with food for pain 60 tablet 0  . PAZEO 0.7 % SOLN INSTILL 1 DROP IN EACH EYE EVERY MORNING    . TURMERIC PO Take by mouth daily.    Marland Kitchen VITAMIN D PO Take by mouth daily.     No current facility-administered medications for this visit.    REVIEW OF SYSTEMS:    A 10+ POINT REVIEW OF SYSTEMS WAS OBTAINED including neurology, dermatology, psychiatry, cardiac, respiratory, lymph, extremities, GI, GU, Musculoskeletal, constitutional, breasts, reproductive, HEENT.  All pertinent positives are noted in the HPI.  All others are negative.   PHYSICAL EXAMINATION:  . Vitals:   01/30/20 1207  BP: (!) 144/86  Pulse: 91  Resp: 18  Temp: 98.2 F (36.8 C)  SpO2: 100%   Filed Weights   01/30/20 1207  Weight: 134 lb 6.4 oz (61 kg)   .Body mass index is 22.37 kg/m.  Marland Kitchen GENERAL:alert, in no acute distress and comfortable SKIN: no acute rashes, no significant lesions EYES: conjunctiva are pink and non-injected, sclera anicteric OROPHARYNX: MMM, no exudates, no oropharyngeal erythema or ulceration NECK: supple, no JVD LYMPH:  no palpable lymphadenopathy in the cervical, axillary or inguinal regions LUNGS: clear to auscultation b/l with normal respiratory effort HEART: regular rate &  rhythm ABDOMEN:  normoactive bowel sounds , non tender, not distended. Extremity: no pedal edema PSYCH: alert & oriented x 3 with fluent speech NEURO: no focal motor/sensory deficits  LABORATORY DATA:  I have reviewed the data as listed  . CBC Latest Ref Rng & Units 01/30/2020 10/06/2019 06/26/2019  WBC 4.0 - 10.5 K/uL 6.3 6.8 5.5  Hemoglobin 12.0 - 15.0 g/dL 13.2 12.7 12.4  Hematocrit 36.0 - 46.0 % 40.0 37.6 38.7  Platelets 150 - 400 K/uL 249 241 253  HGB 10.6 ANC 6.1k  . CMP Latest Ref Rng & Units 01/30/2020 10/06/2019 06/26/2019  Glucose 70 - 99 mg/dL 92 104(H) 102(H)  BUN 6 - 20 mg/dL _0 Creatinine 0.44 - 1.00 mg/dL 0.73 0.75 0.80  Sodium 135 - 145 mmol/L 141 142 140  Potassium 3.5 - 5.1 mmol/L 3.8 4.0 4.1  Chloride 98 - 111 mmol/L 107 108 108  CO2 22 - 32 mmol/L _1 Calcium 8.9 - 10.3 mg/dL 8.5(L) 8.8 8.4(L)  Total Protein 6.5 - 8.1 g/dL 6.5 5.8(L) 5.6(L)  Total Bilirubin 0.3 - 1.2 mg/dL 0.2(L) 0.3 0.2  Alkaline Phos 38 - 126 U/L 52 - -  AST 15 - 41 U/L _2 ALT 0 - 44 U/L _3 . Lab Results  Component Value Date   IRON 74 01/30/2020   TIBC 243 01/30/2020   IRONPCTSAT 31 01/30/2020   (Iron and TIBC)  Lab Results  Component Value Date   FERRITIN 101 01/30/2020   B12 -- 2193  RADIOGRAPHIC STUDIES: I have personally reviewed the radiological images as listed and agreed with the findings in the report. XR Foot 2 Views Left  Result Date: 01/08/2020 PIP and DIP narrowing was noted.  Juxta-articular osteopenia was noted.  Erosive changes were noted in the fifth MTP joint.  No intertarsal or tibiotalar joint space narrowing was noted.  These x-rays were compared from the previous films of May 30, 2017.  There was no radiographic progression. Impression: X-rays  were consistent with osteoarthritis and rheumatoid arthritis with erosive disease.  No radiographic progression was noted.  XR Foot 2 Views Right  Result Date: 01/08/2020 First MTP, PIP  and DIP narrowing was noted.  Juxta-articular osteopenia was noted.  Fifth MTP erosive changes were noted.  No intertarsal tibiotalar joint space narrowing was noted.  No interval change was noted from the x-rays of May 30, 2017. 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  No radiographic progression was noted.  XR Hand 2 View Left  Result Date: 01/08/2020 Severe first and third MCP joint narrowing was noted.  All MCP joint narrowing with juxta-articular osteopenia was noted.  Severe intercarpal, radiocarpal and metacarpal carpal joint space narrowing was noted.  Erosive changes were noted at the base of the first metacarpal.  Erosive changes were noted in all the carpal bones, radius and ulnar styloid.  No radiographic progression was noted from the x-rays obtained on May 30, 2017. Impression: Severe erosive rheumatoid arthritis with no radiographic progression compared to May 30, 2017.  XR Hand 2 View Right  Result Date: 01/08/2020 Juxta-articular osteopenia was noted.  PIP and DIP narrowing was noted.  Subluxation of first MCP joint with erosive changes was noted.  Severe intercarpal and radiocarpal joint space narrowing was noted.  Erosive changes were noted in the carpal bones and also in the radius.  Ulnar styloid erosion was noted.  No radiographic progression was noted from the previous film of May 30, 2017. Impression: These findings are consistent with severe erosive rheumatoid arthritis.  No radiographic progression was noted.   ASSESSMENT & PLAN:  33 y.o. female with  1. Iron Deficiency Anemia -Discussed that her Hgb was around 14 as of 2 years ago, but had dropped to 9.5 in 09/2016. Ferritin was at 13 on 02/22/18.   2. 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  3. h/o B12 deficiency . + parietal cell ab -- suggestive of  pernicious anemia. Could be a cause of the Iron and B12 deficiency B12 currentl WNL @ 2193 -Discussed pt labwork today,  HGB WNL AT 13.2 with 31% iron saturation ratio. Ferritin low at 102. -B12 is >1000 -Goal for Ferritin >100 - no indication for additional IV iron at this time. -Pt will try PO 150mg Iron Polysaccharide which tends to be better tolerated, for maintenance -Continue SL 1000mcg Vitamin B12 -Recommend 2000 units Vitamin D daily and maintain bone health in setting of RA -Recommend taking Vitamin B complex given antiparietal cell antibody and likely lack of adequate absorption. -Continue eating iron rich foods  RTC with Dr  with labs in 6 months   All of the patients questions were answered with apparent satisfaction. The patient knows to call the clinic with any problems, questions or concerns.  . The total time spent in the appointment was 15 minutes and more than 50% was on counseling and direct patient cares.        MD MS AAHIVMS [facility] C[facility] Hematology/Oncology Physician [facility]  (Office):       [phone] (Work cell):  [phone] (Fax):           [phone]   

## 2020-02-06 ENCOUNTER — Ambulatory Visit: Payer: Medicaid Other

## 2020-02-08 ENCOUNTER — Ambulatory Visit: Payer: Medicaid Other

## 2020-02-10 ENCOUNTER — Telehealth: Payer: Self-pay | Admitting: Pharmacy Technician

## 2020-02-10 ENCOUNTER — Encounter: Payer: Self-pay | Admitting: *Deleted

## 2020-02-10 NOTE — Telephone Encounter (Signed)
Attempted to contact the patient and left message for patient to call the office.  

## 2020-02-10 NOTE — Telephone Encounter (Signed)
Received message from [facility] to inquire when patient's next Cimzia injection is due, so they can send over her med. I did not see one scheduled. Please Advise.  9:49 AM Dorthula Nettles, CPhT

## 2020-02-11 NOTE — Telephone Encounter (Signed)
Patient schedule for 02/13/20 for Cimzia injection.

## 2020-02-11 NOTE — Telephone Encounter (Signed)
Patient's Cimzia arrived in the office. It has been placed in the fridge.

## 2020-02-13 ENCOUNTER — Ambulatory Visit (INDEPENDENT_AMBULATORY_CARE_PROVIDER_SITE_OTHER): Payer: Medicaid Other | Admitting: *Deleted

## 2020-02-13 ENCOUNTER — Telehealth: Payer: Self-pay | Admitting: *Deleted

## 2020-02-13 ENCOUNTER — Other Ambulatory Visit: Payer: Self-pay

## 2020-02-13 VITALS — BP 131/80 | HR 103

## 2020-02-13 DIAGNOSIS — M0579 Rheumatoid arthritis with rheumatoid factor of multiple sites without organ or systems involvement: Secondary | ICD-10-CM

## 2020-02-13 MED ORDER — CERTOLIZUMAB PEGOL 2 X 200 MG/ML ~~LOC~~ KIT
400.0000 mg | PACK | Freq: Once | SUBCUTANEOUS | Status: AC
  Administered 2020-02-13: 400 mg via SUBCUTANEOUS

## 2020-02-13 NOTE — Telephone Encounter (Signed)
Patient in office today for a Ciimzia injection. Patient wanted to make Dr. Corliss Skains aware she is having swelling in her right index finger. Patient states it does not last long. Patient states she is not wanting any prednisone at this time.

## 2020-02-13 NOTE — Progress Notes (Signed)
Patient in office for a Cimzia injection. Patient given injection in right and left lower abdomen. Patient tolerated injection well. Patient will return in one month for next injection.   Administrations This Visit    Certolizumab Pegol KIT 400 mg    Admin Date 02/13/2020 Action Given Dose 400 mg Route Subcutaneous Administered By Carole Binning, LPN

## 2020-02-20 ENCOUNTER — Other Ambulatory Visit: Payer: Self-pay | Admitting: Rheumatology

## 2020-02-20 DIAGNOSIS — M0579 Rheumatoid arthritis with rheumatoid factor of multiple sites without organ or systems involvement: Secondary | ICD-10-CM

## 2020-02-20 MED ORDER — CIMZIA PREFILLED 2 X 200 MG/ML ~~LOC~~ KIT: PACK | SUBCUTANEOUS | 0 refills | Status: DC

## 2020-02-20 NOTE — Telephone Encounter (Signed)
Last Visit: 01/08/20 Next Visit: follow up due June 2021 Labs: 01/30/20 total bilirubin 0.2 , Calcium 8.5, Eosinophils Absolute 0.6 TB Gold: 07/07/19 neg

## 2020-02-20 NOTE — Telephone Encounter (Signed)
Please schedule patient a follow up visit. Patient due June 2021. Thanks!

## 2020-03-09 MED FILL — CIMZIA 200 MG/ML SYRN KIT: 2 X 200 | 28 days supply | Qty: 1 | Fill #0

## 2020-03-12 ENCOUNTER — Telehealth: Payer: Self-pay | Admitting: *Deleted

## 2020-03-12 ENCOUNTER — Other Ambulatory Visit: Payer: Self-pay

## 2020-03-12 ENCOUNTER — Ambulatory Visit (INDEPENDENT_AMBULATORY_CARE_PROVIDER_SITE_OTHER): Payer: Medicaid Other | Admitting: *Deleted

## 2020-03-12 VITALS — BP 138/89

## 2020-03-12 DIAGNOSIS — M0579 Rheumatoid arthritis with rheumatoid factor of multiple sites without organ or systems involvement: Secondary | ICD-10-CM | POA: Diagnosis not present

## 2020-03-12 MED ORDER — CERTOLIZUMAB PEGOL 2 X 200 MG/ML ~~LOC~~ KIT
400.0000 mg | PACK | Freq: Once | SUBCUTANEOUS | Status: AC
  Administered 2020-03-12: 400 mg via SUBCUTANEOUS

## 2020-03-12 NOTE — Progress Notes (Signed)
Patient in office for a Cimzia injection. Patient denies fever or infection. Patient was given injection in right and left lower abdomen. Patient tolerated injection well. Patient will return in 1 month for next injection.   Administrations This Visit    Certolizumab Pegol KIT 400 mg    Admin Date 03/12/2020 Action Given Dose 400 mg Route Subcutaneous Administered By Carole Binning, LPN

## 2020-03-12 NOTE — Telephone Encounter (Signed)
Patient in office today for Cimzia injection. Please schedule patient for her next injection. Thanks!

## 2020-03-13 ENCOUNTER — Other Ambulatory Visit: Payer: Self-pay | Admitting: Rheumatology

## 2020-03-15 NOTE — Telephone Encounter (Signed)
Patient is only able to come on Fridays due to class schedule.  Scheduled for 4/23 at 9AM.    She will be due for labs in May.   Verlin Fester, PharmD, [place], CPP Clinical Specialty Pharmacist [phone]  03/15/2020 8:47 AM

## 2020-03-15 NOTE — Telephone Encounter (Signed)
Last Visit: 01/08/20 Next Visit: 06/04/20 Labs: 01/30/20 Eosinophils absolute 0.6, Calcium 8.5, Total Bilirubin: 0.2  Current Dose per office note on 01/08/20:  methotrexate 2.5 mg 8 tablets every 7 days  Okay to refill per Dr. Corliss Skains

## 2020-03-23 NOTE — Telephone Encounter (Addendum)
Received Cimzia injection from [facility].  Placed in fridge.   Verlin Fester, PharmD, [place], CPP Clinical Specialty Pharmacist [phone]  03/23/2020 10:29 AM

## 2020-04-03 ENCOUNTER — Other Ambulatory Visit: Payer: Medicaid Other

## 2020-04-09 ENCOUNTER — Ambulatory Visit (INDEPENDENT_AMBULATORY_CARE_PROVIDER_SITE_OTHER): Payer: Medicaid Other

## 2020-04-09 ENCOUNTER — Telehealth: Payer: Self-pay

## 2020-04-09 ENCOUNTER — Other Ambulatory Visit: Payer: Self-pay

## 2020-04-09 VITALS — BP 134/80 | HR 103

## 2020-04-09 DIAGNOSIS — M0579 Rheumatoid arthritis with rheumatoid factor of multiple sites without organ or systems involvement: Secondary | ICD-10-CM | POA: Diagnosis not present

## 2020-04-09 MED ORDER — CERTOLIZUMAB PEGOL 2 X 200 MG/ML ~~LOC~~ KIT
400.0000 mg | PACK | Freq: Once | SUBCUTANEOUS | Status: AC
  Administered 2020-04-09: 400 mg via SUBCUTANEOUS

## 2020-04-09 NOTE — Progress Notes (Signed)
Patient presents in office for Cimzia injection. Patient denies signs/symptoms of infection, fever or antibiotic use. Patient was given injections in right and left lower abdomen. Patient tolerate well, no adverse reactions. Patient is up to date on labs.  CBC and CMP 01/30/2020.  Tb Gold: 07/07/2019 negative     Administrations This Visit    Certolizumab Pegol KIT 400 mg    Admin Date 04/09/2020 Action Given Dose 400 mg Route Subcutaneous Administered By Earnestine Mealing, CMA

## 2020-04-09 NOTE — Telephone Encounter (Signed)
Called patient to schedule next appointment for monthly cimzia injection.  No answer and left voicemail.  She can schedule her next injection on or after 5/21.  She can continue to receive on Friday if that is her only availability.  She can be placed on nurse schedule.   She will be due for labs prior to her next injection anytime on or after 5/12.    Verlin Fester, PharmD, [place], CPP Clinical Specialty Pharmacist [phone]  04/09/2020 10:51 AM

## 2020-04-09 NOTE — Telephone Encounter (Signed)
Patient was in the office today for Cimzia injection, please call to schedule next appointment. Thanks!

## 2020-04-20 NOTE — Telephone Encounter (Signed)
Attempted to contact the patient and left message on machine to advise patient to call the office to schedule cimzia and also come for labs prior.

## 2020-04-23 NOTE — Telephone Encounter (Signed)
Attempted to contact the patient and left message on machine to advise patient to call the office to schedule cimzia and also come for labs prior.  

## 2020-04-27 ENCOUNTER — Encounter: Payer: Self-pay | Admitting: *Deleted

## 2020-04-27 NOTE — Telephone Encounter (Signed)
My Chart message sent to patient to advise patient to call the office to schedule cimzia and also come for labs prior.

## 2020-05-07 ENCOUNTER — Telehealth: Payer: Self-pay | Admitting: Rheumatology

## 2020-05-07 ENCOUNTER — Other Ambulatory Visit: Payer: Self-pay | Admitting: Rheumatology

## 2020-05-07 ENCOUNTER — Other Ambulatory Visit: Payer: Self-pay

## 2020-05-07 DIAGNOSIS — Z79899 Other long term (current) drug therapy: Secondary | ICD-10-CM

## 2020-05-07 DIAGNOSIS — Z111 Encounter for screening for respiratory tuberculosis: Secondary | ICD-10-CM

## 2020-05-07 DIAGNOSIS — M0579 Rheumatoid arthritis with rheumatoid factor of multiple sites without organ or systems involvement: Secondary | ICD-10-CM

## 2020-05-07 MED FILL — CIMZIA 200 MG/ML SYRN KIT: 2 X 200 | 28 days supply | Qty: 1 | Fill #0

## 2020-05-07 NOTE — Telephone Encounter (Signed)
Patient will come for labs today. Patient is now scheduled for 05/13/2020 at 1:00 for cimzia injection. Lab orders are in place.

## 2020-05-07 NOTE — Telephone Encounter (Signed)
Last Visit: 01/08/2020 Next Visit: 06/04/2020 Labs: 01/30/2020 calcium 8.5, total bilirubin  0.2, eosinophils absolute 0.6. TB Gold: 07/07/2019 negative   Okay to refill per Dr. Corliss Skains.

## 2020-05-07 NOTE — Telephone Encounter (Signed)
Patient left a voicemail stating she was returning Andrea's call regarding scheduling her Cimzia injection.  Patient states she is available on Thursday and Friday after 12:00 pm or Monday/Wednesday after 3:30 pm.

## 2020-05-10 LAB — COMPLETE METABOLIC PANEL WITH GFR
AG Ratio: 2.3 (calc) (ref 1.0–2.5)
ALT: 15 U/L (ref 6–29)
AST: 16 U/L (ref 10–30)
Albumin: 4.1 g/dL (ref 3.6–5.1)
Alkaline phosphatase (APISO): 52 U/L (ref 31–125)
BUN: 17 mg/dL (ref 7–25)
CO2: 28 mmol/L (ref 20–32)
Calcium: 8.9 mg/dL (ref 8.6–10.2)
Chloride: 104 mmol/L (ref 98–110)
Creat: 0.78 mg/dL (ref 0.50–1.10)
GFR, Est African American: 115 mL/min/{1.73_m2} (ref >=60)
GFR, Est Non African American: 99 mL/min/{1.73_m2} (ref >=60)
Globulin: 1.8 g/dL (calc) — ABNORMAL LOW (ref 1.9–3.7)
Glucose, Bld: 82 mg/dL (ref 65–99)
Potassium: 4.2 mmol/L (ref 3.5–5.3)
Sodium: 141 mmol/L (ref 135–146)
Total Bilirubin: 0.3 mg/dL (ref 0.2–1.2)
Total Protein: 5.9 g/dL — ABNORMAL LOW (ref 6.1–8.1)

## 2020-05-10 LAB — CBC WITH DIFFERENTIAL/PLATELET
Absolute Monocytes: 490 cells/uL (ref 200–950)
Basophils Absolute: 41 cells/uL (ref 0–200)
Basophils Relative: 0.6 %
Eosinophils Absolute: 537 cells/uL — ABNORMAL HIGH (ref 15–500)
Eosinophils Relative: 7.9 %
HCT: 41 % (ref 35.0–45.0)
Hemoglobin: 13.3 g/dL (ref 11.7–15.5)
Lymphs Abs: 1618 cells/uL (ref 850–3900)
MCH: 31.5 pg (ref 27.0–33.0)
MCHC: 32.4 g/dL (ref 32.0–36.0)
MCV: 97.2 fL (ref 80.0–100.0)
MPV: 11.3 fL (ref 7.5–12.5)
Monocytes Relative: 7.2 %
Neutro Abs: 4114 cells/uL (ref 1500–7800)
Neutrophils Relative %: 60.5 %
Platelets: 341 10*3/uL (ref 140–400)
RBC: 4.22 10*6/uL (ref 3.80–5.10)
RDW: 12.4 % (ref 11.0–15.0)
Total Lymphocyte: 23.8 %
WBC: 6.8 10*3/uL (ref 3.8–10.8)

## 2020-05-10 LAB — QUANTIFERON-TB GOLD PLUS
Mitogen-NIL: >10 IU/mL
NIL: 0.02 IU/mL
QuantiFERON-TB Gold Plus: NEGATIVE
TB1-NIL: 0.03 IU/mL
TB2-NIL: 0.04 IU/mL

## 2020-05-10 NOTE — Progress Notes (Signed)
Labs are stable.

## 2020-05-13 ENCOUNTER — Ambulatory Visit: Payer: Self-pay

## 2020-05-14 ENCOUNTER — Ambulatory Visit
Admission: RE | Admit: 2020-05-14 | Discharge: 2020-05-14 | Disposition: A | Discharge status: Home / Self Care | Payer: Medicaid Other | Source: Ambulatory Visit

## 2020-05-14 ENCOUNTER — Ambulatory Visit: Payer: Medicaid Other

## 2020-05-14 ENCOUNTER — Telehealth: Payer: Self-pay | Admitting: Rheumatology

## 2020-05-14 ENCOUNTER — Other Ambulatory Visit: Payer: Self-pay

## 2020-05-14 DIAGNOSIS — K047 Periapical abscess without sinus: Secondary | ICD-10-CM

## 2020-05-14 MED ORDER — AMOXICILLIN 500 MG PO CAPS
500.0000 mg | ORAL_CAPSULE | Freq: Two times a day (BID) | ORAL | 0 refills | Status: AC
Start: 2020-05-14 — End: 2020-05-21

## 2020-05-14 NOTE — Telephone Encounter (Signed)
Noted  

## 2020-05-14 NOTE — ED Provider Notes (Signed)
Virtual Visit via Video Note:  Hayley Burke  initiated request for Telemedicine visit with [facility] team. I connected with [name]  on 05/14/2020 at 11:58 AM  for a synchronized telemedicine visit using a video enabled HIPPA compliant telemedicine application. I verified that I am speaking with [name]  using two identifiers. Holden Maniscalco C Keiran Gaffey, PA-C  was physically located in a [facility] Urgent care site and Makhya Arave was located at a different location.   The limitations of evaluation and management by telemedicine as well as the availability of in-person appointments were discussed. Patient was informed that she  may incur a bill ( including co-pay) for this virtual visit encounter. [name]  expressed understanding and gave verbal consent to proceed with virtual visit.     History of Present Illness:[name]  is a 34 y.o. female presents for evaluation of possible dental infection.  Has had some pain swelling and some sensitivity to her right lower jaw.  Feels similar to prior infection that she had 2 years ago which resolved with antibiotics.  She has plans to follow-up with [facility] on June 15.  She has used ibuprofen which provides temporary relief.  Denies fevers, neck stiffness or difficulty swallowing.     Allergies  Allergen Reactions  . Sulfa Antibiotics Hives     Past Medical History:  Diagnosis Date  . Rheumatoid arthritis (HCC)      Social History   Tobacco Use  . Smoking status: Former Smoker    Packs/day: 1.00    Years: 15.00    Pack years: 15.00    Types: Cigarettes    Quit date: 06/26/2012    Years since quitting: 7.8  . Smokeless tobacco: Never Used  Substance Use Topics  . Alcohol use: No  . Drug use: No        Observations/Objective: Physical Exam  Constitutional: She is oriented to person, place, and time. No distress.  HENT:  Head: Normocephalic and atraumatic.  No obvious facial swelling  Neck:  Full active  range of motion of neck, no overlying neck erythema or swelling noted  Pulmonary/Chest: Effort normal. No respiratory distress.  Speaking full sentences  Neurological: She is alert and oriented to person, place, and time.  Speech clear, face symmetric     Assessment and Plan:  Dental infection  Treating with amoxicillin for dental infection, continue Tylenol and ibuprofen for pain management.  Follow-up with dentistry as planned.  Patient is to follow-up in person if developing any persistent or worsening symptoms despite use of oral antibiotics.  Discussed strict return precautions. Patient verbalized understanding and is agreeable with plan.    Follow Up Instructions:     I discussed the assessment and treatment plan with the patient. The patient was provided an opportunity to ask questions and all were answered. The patient agreed with the plan and demonstrated an understanding of the instructions.   The patient was advised to call back or seek an in-person evaluation if the symptoms worsen or if the condition fails to improve as anticipated.      Lew Dawes, PA-C  05/14/2020 11:58 AM         Lew Dawes, PA-C 05/14/20 1207

## 2020-05-14 NOTE — Telephone Encounter (Signed)
Patient left a voicemail to let you know that [facility] prescribed Amoxicillin (500 mg) 1 capsule 2 times/day for 7 days for her dental pain.

## 2020-05-21 NOTE — Progress Notes (Signed)
Office Visit Note  Patient: Hayley Burke             Date of Birth: 12/23/1985           MRN: [id number]             PCP: Rory Percy, DO Referring: Rory Percy, DO Visit Date: 06/04/2020 Occupation: '@GUAROCC'$ @  Subjective:  Medication monitoring   History of Present Illness: Hayley Burke is a 34 y.o. female with history of rheumatoid arthritis.  She had been doing well on Cimzia monthly injections in the office.  She states she had to delay her Cimzia injection for about 3 weeks due to dental abscess.  She was on antibiotics.  She developed a flare with increased pain and swelling in her left elbow.  She received a prednisone taper which helped her symptoms.  Is a still on prednisone 15 mg p.o. daily.  She is also removing process which puts increases stress on her joints.  She came today to receive her Cimzia injection.  She also has a dental extraction scheduled in 2 weeks .  Activities of Daily Living:  Patient reports morning stiffness for 1 hour.   Patient Denies nocturnal pain.  Difficulty dressing/grooming: Denies Difficulty climbing stairs: Denies Difficulty getting out of chair: Denies Difficulty using hands for taps, buttons, cutlery, and/or writing: Denies  Review of Systems  Constitutional: Positive for fatigue. Negative for night sweats, weight gain and weight loss.  HENT: Positive for mouth dryness. Negative for mouth sores, trouble swallowing, trouble swallowing and nose dryness.   Eyes: Positive for dryness. Negative for pain, redness and visual disturbance.  Respiratory: Negative for cough, shortness of breath and difficulty breathing.   Cardiovascular: Negative for chest pain, palpitations, hypertension, irregular heartbeat and swelling in legs/feet.  Gastrointestinal: Negative for blood in stool, constipation and diarrhea.  Endocrine: Negative for excessive thirst and increased urination.  Genitourinary: Negative for difficulty urinating and vaginal dryness.    Musculoskeletal: Positive for morning stiffness. Negative for arthralgias, joint pain, joint swelling, myalgias, muscle weakness, muscle tenderness and myalgias.  Skin: Negative for color change, rash, hair loss, skin tightness, ulcers and sensitivity to sunlight.  Allergic/Immunologic: Negative for susceptible to infections.  Neurological: Negative for dizziness, numbness, memory loss, night sweats and weakness.  Hematological: Negative for bruising/bleeding tendency and swollen glands.  Psychiatric/Behavioral: Negative for depressed mood and sleep disturbance. The patient is not nervous/anxious.     PMFS History:  Patient Active Problem List   Diagnosis Date Noted  . 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 IUD (intrauterine device) in place 07/07/2019  . Pernicious anemia 03/20/2018  . Rheumatoid arthritis with rheumatoid factor of multiple sites without organ or systems involvement ([place]) 05/03/2017  . High risk medications (not anticoagulants) long-term use 05/03/2017  . Family history of breast cancer 01/19/2016    Past Medical History:  Diagnosis Date  . Rheumatoid arthritis ([place])     Family History  Problem Relation Age of Onset  . Sjogren's syndrome Mother   . Breast cancer Mother   . Depression Mother   . Lung cancer Father   . Hyperlipidemia Father   . Drug abuse Brother   . Heart attack Paternal Grandfather    Past Surgical History:  Procedure Laterality Date  . CESAREAN SECTION    . TONSILLECTOMY AND ADENOIDECTOMY     Social History   Social History Narrative  . Not on file   Immunization History  Administered Date(s) Administered  . Influenza,inj,Quad PF,6+ Mos  11/07/2018  . Pneumococcal Polysaccharide-23 01/06/2016  . Tdap 05/12/2018     Objective: Vital Signs: BP 120/80 (BP Location: Left Arm, Patient Position: Sitting, Cuff Size: Small)   Pulse (!) 122   Resp 12   Ht '5\' 5"'$  (1.651 m)   Wt 129 lb (58.5 kg)   BMI 21.47 kg/m    Physical Exam Vitals  and nursing note reviewed.  Constitutional:      Appearance: She is well-developed.  HENT:     Head: Normocephalic and atraumatic.  Eyes:     Conjunctiva/sclera: Conjunctivae normal.  Cardiovascular:     Rate and Rhythm: Normal rate and regular rhythm.     Heart sounds: Normal heart sounds.  Pulmonary:     Effort: Pulmonary effort is normal.     Breath sounds: Normal breath sounds.  Abdominal:     General: Bowel sounds are normal.     Palpations: Abdomen is soft.  Musculoskeletal:     Cervical back: Normal range of motion.  Lymphadenopathy:     Cervical: No cervical adenopathy.  Skin:    General: Skin is warm and dry.     Capillary Refill: Capillary refill takes less than 2 seconds.  Neurological:     Mental Status: She is alert and oriented to person, place, and time.  Psychiatric:        Behavior: Behavior normal.      Musculoskeletal Exam: C-spine, thoracic and lumbar spine with good range of motion.  Shoulder joints, elbow joints, wrist joints with good range of motion.  She had no synovitis of her elbow joints, wrist joints or MCPs.  Some prominence of DIP joints were noted.  Hip joints, knee joints MTPs and PIPs with good range of motion with no synovitis.  CDAI Exam: CDAI Score: 0  Patient Global: 0 mm; Provider Global: 0 mm Swollen: 0 ; Tender: 0  Joint Exam 06/04/2020   No joint exam has been documented for this visit   There is currently no information documented on the homunculus. Go to the Rheumatology activity and complete the homunculus joint exam.  Investigation: No additional findings.  Imaging: No results found.  Recent Labs: Lab Results  Component Value Date   WBC 6.8 05/07/2020   HGB 13.3 05/07/2020   PLT 341 05/07/2020   NA 141 05/07/2020   K 4.2 05/07/2020   CL 104 05/07/2020   CO2 28 05/07/2020   GLUCOSE 82 05/07/2020   BUN 17 05/07/2020   CREATININE 0.78 05/07/2020   BILITOT 0.3 05/07/2020   ALKPHOS 52 01/30/2020   AST 16  05/07/2020   ALT 15 05/07/2020   PROT 5.9 (L) 05/07/2020   ALBUMIN 4.1 01/30/2020   CALCIUM 8.9 05/07/2020   GFRAA 115 05/07/2020   QFTBGOLDPLUS NEGATIVE 05/07/2020    Speciality Comments: No specialty comments available.  Procedures:  No procedures performed Allergies: Sulfa antibiotics   Assessment / Plan:     Visit Diagnoses: Rheumatoid arthritis with rheumatoid factor of multiple sites without organ or systems involvement ([place]) -patient had a recent flare of rheumatoid arthritis while she was off Cimzia for about 3 weeks due to dental abscess.  She was treated with antibiotics and is completely recovered.  She was given a prednisone taper due to a flare.  She is currently on prednisone 15 mg p.o. daily.  She is tapering it by 5 mg every 2 days.  She had no synovitis on my examination today.  She was given her Cimzia injection in the office  today.  She tolerated the procedure well.  Plan: Certolizumab Pegol KIT 400 mg  High risk medication use - Cimzia 400 mg every 28 days in office started on 08/21/18, methotrexate 2.5 mg 8 tablets every 7 days, and folic acid 1 mg 2 tablets daily.  Her labs were normal in May.  We will continue to monitor labs every 3 months.  Vitamin D deficiency-she is on supplement.  History of anemia-resolved.  Folliculitis  Orders: No orders of the defined types were placed in this encounter.  Meds ordered this encounter  Medications  . Certolizumab Pegol KIT 400 mg      Follow-Up Instructions: Return in about 5 months (around 11/04/2020) for Rheumatoid arthritis.   Bo Merino, MD  Note - This record has been created using Editor, commissioning.  Chart creation errors have been sought, but may not always  have been located. Such creation errors do not reflect on  the standard of medical care.

## 2020-05-26 ENCOUNTER — Ambulatory Visit
Admission: RE | Admit: 2020-05-26 | Discharge status: Absent without leave | Payer: Medicaid Other | Source: Ambulatory Visit

## 2020-05-26 ENCOUNTER — Other Ambulatory Visit: Payer: Self-pay | Admitting: *Deleted

## 2020-05-30 ENCOUNTER — Telehealth: Payer: Self-pay | Admitting: Physician Assistant

## 2020-05-30 ENCOUNTER — Encounter: Payer: Self-pay | Admitting: Physician Assistant

## 2020-05-30 ENCOUNTER — Encounter: Payer: Self-pay | Admitting: Rheumatology

## 2020-05-30 ENCOUNTER — Other Ambulatory Visit: Payer: Self-pay | Admitting: Physician Assistant

## 2020-05-30 MED ORDER — PREDNISONE 5 MG PO TABS
ORAL_TABLET | ORAL | 0 refills | Status: DC
Start: 2020-05-30 — End: 2020-06-24

## 2020-05-30 NOTE — Telephone Encounter (Signed)
The patient called today having a flare in her right elbow joint, which started last night.  She is in the processs of moving, which has exacerbated her discomfort. She is having difficulty straightening her elbow.  She denies any warmth, swelling, or erythema at this time.  She denies any other joint pain or joint swelling at this time.  She has had to postpone her most recent cimzia injection due to being on antibiotics by her dentist.  She has been cleared to resume cimzia, and she will call the office Monday to schedule a nurse visit for the administration of her next injection.  She requested a prednisone taper.  I sent a prednisone taper starting at 20 mg tapering by 5 mg every 4 days.  She was advised to notify us if her joint pain and joint swelling persists or worsens.  Sherron Ales, PA-C

## 2020-05-31 NOTE — Telephone Encounter (Signed)
Okay to send a prednisone taper starting at 20 mg and taper by 5 mg every 2 days.

## 2020-05-31 NOTE — Telephone Encounter (Signed)
Patient called and spoke with Sheppard Evens on 05/30/2020 and Ladona Ridgel sent in a prescription to the pharmacy for a prednisone to the pharmacy for patient on 05/30/2020.

## 2020-06-03 ENCOUNTER — Other Ambulatory Visit: Payer: Self-pay | Admitting: Rheumatology

## 2020-06-03 DIAGNOSIS — M0579 Rheumatoid arthritis with rheumatoid factor of multiple sites without organ or systems involvement: Secondary | ICD-10-CM

## 2020-06-04 ENCOUNTER — Other Ambulatory Visit: Payer: Self-pay

## 2020-06-04 ENCOUNTER — Ambulatory Visit (INDEPENDENT_AMBULATORY_CARE_PROVIDER_SITE_OTHER): Payer: Medicaid Other | Admitting: Rheumatology

## 2020-06-04 ENCOUNTER — Encounter: Payer: Self-pay | Admitting: Rheumatology

## 2020-06-04 VITALS — BP 120/80 | HR 122 | Resp 12 | Ht 65.0 in | Wt 129.0 lb

## 2020-06-04 DIAGNOSIS — Z79899 Other long term (current) drug therapy: Secondary | ICD-10-CM

## 2020-06-04 DIAGNOSIS — Z862 Personal history of diseases of the blood and blood-forming organs and certain disorders involving the immune mechanism: Secondary | ICD-10-CM | POA: Diagnosis not present

## 2020-06-04 DIAGNOSIS — E559 Vitamin D deficiency, unspecified: Secondary | ICD-10-CM | POA: Diagnosis not present

## 2020-06-04 DIAGNOSIS — M0579 Rheumatoid arthritis with rheumatoid factor of multiple sites without organ or systems involvement: Secondary | ICD-10-CM | POA: Diagnosis not present

## 2020-06-04 MED ORDER — CERTOLIZUMAB PEGOL 2 X 200 MG/ML ~~LOC~~ KIT
400.0000 mg | PACK | Freq: Once | SUBCUTANEOUS | Status: AC
  Administered 2020-06-04: 400 mg via SUBCUTANEOUS

## 2020-06-04 NOTE — Patient Instructions (Signed)
Standing Labs We placed an order today for your standing lab work.   Please have your standing labs drawn in August and every 3 months  If possible, please have your labs drawn 2 weeks prior to your appointment so that the provider can discuss your results at your appointment.  We have open lab daily Monday through Thursday from 8:30-12:30 PM and 1:30-4:30 PM and Friday from 8:30-12:30 PM and 1:30-4:00 PM at the office of Dr. Kyriaki Moder, [facility].   You may experience shorter wait times on Monday and Friday afternoons. The office is located at [address], [address], [place], [place] [address] No appointment is necessary.   Labs are drawn by [facility].  You may receive a bill from [facility] for your lab work.  If you wish to have your labs drawn at another location, please call the office 24 hours in advance to send orders.  If you have any questions regarding directions or hours of operation,  please call [phone].   As a reminder, please drink plenty of water prior to coming for your lab work. Thanks!  

## 2020-06-04 NOTE — Progress Notes (Signed)
Patient in office for an office visit. While in office patient received her Cimzia injection. Patient's labs and TB Gold are update to date. Patient denies any fever, infection or use of antibiotics. Patient received injections in right and left lower abdomen. Patient tolerated injections well and will return in 1 month for next injection.   Administrations This Visit    Certolizumab Pegol KIT 400 mg    Admin Date 06/04/2020 Action Given Dose 400 mg Route Subcutaneous Administered By Carole Binning, LPN

## 2020-06-09 ENCOUNTER — Ambulatory Visit: Payer: Self-pay

## 2020-06-09 ENCOUNTER — Telehealth: Payer: Self-pay

## 2020-06-09 ENCOUNTER — Telehealth: Payer: Medicaid Other

## 2020-06-09 ENCOUNTER — Encounter: Payer: Self-pay | Admitting: Family Medicine

## 2020-06-09 ENCOUNTER — Other Ambulatory Visit: Payer: Self-pay

## 2020-06-09 ENCOUNTER — Ambulatory Visit (INDEPENDENT_AMBULATORY_CARE_PROVIDER_SITE_OTHER): Payer: Medicaid Other | Admitting: Family Medicine

## 2020-06-09 VITALS — BP 130/70 | HR 101 | Temp 98.5°F | Ht 65.0 in

## 2020-06-09 DIAGNOSIS — H6690 Otitis media, unspecified, unspecified ear: Secondary | ICD-10-CM | POA: Diagnosis not present

## 2020-06-09 DIAGNOSIS — H60502 Unspecified acute noninfective otitis externa, left ear: Secondary | ICD-10-CM | POA: Insufficient documentation

## 2020-06-09 DIAGNOSIS — J029 Acute pharyngitis, unspecified: Secondary | ICD-10-CM | POA: Diagnosis not present

## 2020-06-09 MED ORDER — OFLOXACIN 0.3 % OT SOLN
5.0000 [drp] | Freq: Every day | OTIC | 0 refills | Status: AC
Start: 2020-06-09 — End: 2020-06-16

## 2020-06-09 MED ORDER — AMOXICILLIN-POT CLAVULANATE ER 1000-62.5 MG PO TB12: 1.0000 | ORAL_TABLET | Freq: Two times a day (BID) | ORAL | 0 refills | Status: AC

## 2020-06-09 MED ORDER — NEOMYCIN-POLYMYXIN-HC 1 % OT SOLN: 3.0000 [drp] | Freq: Four times a day (QID) | OTIC | 0 refills | Status: DC

## 2020-06-09 NOTE — Progress Notes (Signed)
SUBJECTIVE:   CHIEF COMPLAINT / HPI:   Acute left-sided ear pain Patient with 2 days pain of left ear.  Says most significantly today it feels like it has been popping multiple times.  She has had known sick contacts as there have been multiple ear infections in her household in the last week.  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  Does not have a significant history as an adult of ear infections.  Of note is immunocompromise due to rheumatoid arthritis medications.  No balance issues, dizziness, change in hearing today other than the popping sound.  Denies any traumas  Viral pharyngitis Patient states that she "lost her voice "today.  She has had no difficulty swallowing or eating and there is no concern for airway.  No shortness of breath.  She is also experiencing a left-sided ear infection otitis media/externa right now.    PERTINENT  PMH / PSH: Rheumatoid arthritis on immunocompromising medication  OBJECTIVE:   BP 130/70   Pulse (!) 101   Temp 98.5 F (36.9 C) (Oral)   Ht 5\' 5"  (1.651 m)   SpO2 99%   BMI 21.47 kg/m   General: Alert and pleasant in no distress Eyes: Clear sclera, no conjunctivitis or eye involvement Ear exam: Right side normal with no pain to the pinna and tympanic membrane clear.  Left side with irritated ear canal, minor tenderness to manipulation of the pinna and mild effusion behind left membrane.  Membrane did appear intact from what I could visualize Throat exam: Patient did have voice changes consistent with pharyngitis, no significant anterior cervical chain lymphadenopathy, there was some mild submandibular tenderness but no indication of Ludwig angina.  There is no visible swelling or exudate to visual exam of the internal oropharynx Lung exam: There to auscultation bilaterally, no increased work of breathing, no wheeze or cough during our exam Cardiac: Regular rate and  rhythm  ASSESSMENT/PLAN:   Acute otitis externa of left ear Patient with 2 days pain of left ear.  Says most significantly today it feels like it has been popping multiple times.  She has had known sick contacts as there have been multiple ear infections in her household in the last week.  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  Does not have a significant history as an adult of ear infections.  Of note is immunocompromise due to rheumatoid arthritis medications  Physical exam shows combined otitis media/externa.  Discussed that given patient's otherwise well appearance would likely not start antibiotics but given that she is immunocompromise it would make sense to give Augmentin which is being prescribed at the 1000 mg dose twice daily for 7 days.  Along with external eardrops.  Return precautions were discussed  Acute otitis media Patient with 2 days pain of left ear.  Says most significantly today it feels like it has been popping multiple times.  She has had known sick contacts as there have been multiple ear infections in her household in the last week.  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  Does not have a significant history as an adult of ear infections.  Of note is immunocompromise due to rheumatoid arthritis medications  Physical exam shows combined otitis media/externa.  Discussed that given patient's otherwise well appearance would likely not start antibiotics but given  that she is immunocompromise it would make sense to give Augmentin which is being prescribed at the 1000 mg dose twice daily for 7 days.  Along with external eardrops.  Return precautions were discussed  Viral pharyngitis Patient states that she "lost her voice "today.  She has had no difficulty swallowing or eating and there is no concern for airway.  No shortness of breath.  She is also experiencing a  left-sided ear infection otitis media/externa right now.  Discussed that this is almost definitely viral, she is a good and reassuring physical exam with no visible exudate or edema.  Do not see indication for medication for this at this time as viral pharyngitis should self resolve   *Did give patient instructions on how to order Covid testing in order to clear her for work although I do not think that this is likely etiology  Marthenia Rolling, DO [facility]

## 2020-06-09 NOTE — Assessment & Plan Note (Signed)
Patient with 2 days pain of left ear.  Says most significantly today it feels like it has been popping multiple times.  She has had known sick contacts as there have been multiple ear infections in her household in the last week.  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  Does not have a significant history as an adult of ear infections.  Of note is immunocompromise due to rheumatoid arthritis medications  Physical exam shows combined otitis media/externa.  Discussed that given patient's otherwise well appearance would likely not start antibiotics but given that she is immunocompromise it would make sense to give Augmentin which is being prescribed at the 1000 mg dose twice daily for 7 days.  Along with external eardrops.  Return precautions were discussed

## 2020-06-09 NOTE — Telephone Encounter (Signed)
Patient calls nurse line stating Medicaid does not cover drops that were sent in this afternoon. Please see below for alternatives.

## 2020-06-09 NOTE — Assessment & Plan Note (Signed)
Patient states that she "lost her voice "today.  She has had no difficulty swallowing or eating and there is no concern for airway.  No shortness of breath.  She is also experiencing a left-sided ear infection otitis media/externa right now.  Discussed that this is almost definitely viral, she is a good and reassuring physical exam with no visible exudate or edema.  Do not see indication for medication for this at this time as viral pharyngitis should self resolve

## 2020-06-09 NOTE — Telephone Encounter (Signed)
Ofloxacin otic drops sent instead as they should be preferred by medicaid.  -Dr. Lataysha Vohra

## 2020-06-09 NOTE — Patient Instructions (Signed)
Today after exam and discussion I think that you have an mixed otitis medias/externa which is an inner and outer ear infection.  Given your immunocompromise status I think it would be reasonable to give you some antibiotics for this.  I think your throat symptoms are likely a viral pharyngitis.  Is my hope that this will all resolve very soon, if something gets worse please reach out to physician immediately but usually this should resolve within a week or so.  The medicines are given you should be for 1 week.  Please notify your rheumatologist that they are aware of how you are doing  I do not think Covid is an obvious cause for this symptom series but if your work or school needed to be tested you can call this number for information about community testing, call [phone].  -Let us know if you need anything Dr. Parke Simmers

## 2020-06-09 NOTE — Assessment & Plan Note (Signed)
Patient with 2 days pain of left ear.  Says most significantly today it feels like it has been popping multiple times.  She has had known sick contacts as there have been multiple ear infections in her household in the last week.  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  Does not have a significant history as an adult of ear infections.  Of note is immunocompromise due to rheumatoid arthritis medications  Physical exam shows combined otitis media/externa.  Discussed that given patient's otherwise well appearance would likely not start antibiotics but given that she is immunocompromise it would make sense to give Augmentin which is being prescribed at the 1000 mg dose twice daily for 7 days.  Along with external eardrops.  Return precautions were discussed 

## 2020-06-11 ENCOUNTER — Encounter: Payer: Self-pay | Admitting: Family Medicine

## 2020-06-11 DIAGNOSIS — R110 Nausea: Secondary | ICD-10-CM

## 2020-06-14 ENCOUNTER — Other Ambulatory Visit: Payer: Self-pay | Admitting: Rheumatology

## 2020-06-14 MED ORDER — ONDANSETRON HCL 4 MG PO TABS: 4.0000 mg | ORAL_TABLET | Freq: Three times a day (TID) | ORAL | 0 refills | Status: AC | PRN

## 2020-06-14 NOTE — Telephone Encounter (Signed)
Sending in Zofran for patient's reported nausea.  I am advising them that balance issues are more likely related to their ear issue or could be something more significantly neurologic and that they should come in to be seen for that.  Goal for  Zofran is not to delay this but to make her more comfortable while she waits to get an appointment.  Dr. Parke Simmers

## 2020-06-14 NOTE — Telephone Encounter (Signed)
Patient requests a refill on [facility] [address] [facility] in [place]. Also, patient had an ear infection that had to be treated with antibiotic / steroid ear drops for 7 days. Patient started them 06/09/2020. Please call to advise.

## 2020-06-15 MED ORDER — METHOTREXATE 2.5 MG PO TABS: ORAL_TABLET | ORAL | 0 refills | Status: DC

## 2020-06-15 NOTE — Telephone Encounter (Signed)
Patient states she had viral pharyngitis and also had an ear infection. Patient was given antibiotic ear drops. Patient started them on 06/09/2020. Patient will complete the antibiotic ear drops tomorrow (06/16/2020)   Last Visit: 06/04/2020  Next Visit: due November 2021.  Labs: 05/07/2020 Stable  Current Dose per office note 06/04/2020: methotrexate 2.5 mg 8 tablets every 7 days DX:  Rheumatoid arthritis   Okay to refill MTX and should she hold until she has finished the ear drops?

## 2020-06-15 NOTE — Telephone Encounter (Signed)
Please advise the patient to hold MTX until her ear infection and pharyngitis have completely resolved.

## 2020-06-15 NOTE — Telephone Encounter (Signed)
Patient advised the patient to hold MTX until her ear infection and pharyngitis have completely resolved. Patient verbalized understanding.

## 2020-06-21 ENCOUNTER — Other Ambulatory Visit: Payer: Self-pay | Admitting: Rheumatology

## 2020-06-21 DIAGNOSIS — M0579 Rheumatoid arthritis with rheumatoid factor of multiple sites without organ or systems involvement: Secondary | ICD-10-CM

## 2020-06-22 MED FILL — CIMZIA 200 MG/ML SYRN KIT: 2 X 200 | 28 days supply | Qty: 1 | Fill #0

## 2020-06-22 NOTE — Telephone Encounter (Signed)
Please schedule patient for a follow up visit. Patient due November 2021. Thanks!  

## 2020-06-22 NOTE — Telephone Encounter (Signed)
Last Visit: 06/04/2020  Next Visit: due November 2021. Message sent to the front to schedule patient.  Labs: 05/07/2020 Stable TB Gold: 05/07/2020 Neg   Current Dose per office note 06/04/2020: Cimzia 400 mg every 28 days in office   Okay to refill per Dr. Corliss Skains

## 2020-06-22 NOTE — Telephone Encounter (Signed)
I LMOM for patient to call, and schedule a follow up appointment for November 2021.

## 2020-06-23 ENCOUNTER — Telehealth: Payer: Self-pay

## 2020-06-23 NOTE — Telephone Encounter (Signed)
Cimzia received in the office and placed in the fridge. Patient does not have an upcoming appointment scheduled.

## 2020-06-23 NOTE — Telephone Encounter (Signed)
Attempted to contact the patient and left message to advise patient we need to schedule an appointment for her next Cimzia injection. Patient is due for injection on 07/02/2020.

## 2020-06-24 ENCOUNTER — Telehealth: Payer: Self-pay | Admitting: Rheumatology

## 2020-06-24 MED ORDER — PREDNISONE 5 MG PO TABS: ORAL_TABLET | ORAL | 0 refills | Status: DC

## 2020-06-24 NOTE — Telephone Encounter (Signed)
Verified verbal prescription with Sherron Ales, PA-C to send in prednisone taper starting at 20 mg tapering by 5 mg every 4 days.  #40 No refills.   Patient advised prescription sent to the pharmacy.   Scheduled patient for next Cimzia injection.

## 2020-06-24 NOTE — Telephone Encounter (Signed)
Patient left a voicemail stating she is having a flair-up in her right elbow.  Patient states she is having difficulty bending it.  Patient is requesting a prescription of Prednisone (taper dose) for the inflammation.  [facility] [address] [facility] [place].  Patient states if you need to reach her please call #[phone].  She will be in class from 9-12pm, but will try to answer.

## 2020-06-24 NOTE — Telephone Encounter (Signed)
Ok to send in prednisone taper starting at 20 mg tapering by 5 mg every 4 days.  If her pain and inflammation persists or worsens she may require a cortisone injection in the future.

## 2020-07-02 ENCOUNTER — Ambulatory Visit: Payer: Medicaid Other

## 2020-07-02 ENCOUNTER — Telehealth: Payer: Self-pay | Admitting: *Deleted

## 2020-07-02 NOTE — Telephone Encounter (Signed)
Patient contacted the office and left message stating she is unable to keep her appointment for today (07/02/2020). Patient requested to reschedule her appointment. Attempted to contact the patient and left message for patient to call the office.

## 2020-07-08 ENCOUNTER — Telehealth: Payer: Self-pay | Admitting: Rheumatology

## 2020-07-08 NOTE — Telephone Encounter (Signed)
Patient rescheduled for 07/12/2020 at 10:30 am for Cimzia injection.

## 2020-07-08 NOTE — Telephone Encounter (Signed)
Patient left a voicemail stating she was returning your call.   

## 2020-07-12 ENCOUNTER — Ambulatory Visit (INDEPENDENT_AMBULATORY_CARE_PROVIDER_SITE_OTHER): Payer: Medicaid Other | Admitting: Pharmacist

## 2020-07-12 ENCOUNTER — Other Ambulatory Visit: Payer: Self-pay

## 2020-07-12 VITALS — BP 105/64 | HR 87

## 2020-07-12 DIAGNOSIS — M0579 Rheumatoid arthritis with rheumatoid factor of multiple sites without organ or systems involvement: Secondary | ICD-10-CM

## 2020-07-12 MED ORDER — CERTOLIZUMAB PEGOL 2 X 200 MG/ML ~~LOC~~ KIT
400.0000 mg | PACK | Freq: Once | SUBCUTANEOUS | Status: AC
  Administered 2020-07-12: 400 mg via SUBCUTANEOUS

## 2020-07-12 NOTE — Progress Notes (Signed)
Pharmacy Note  Subjective:   Patient presents to clinic today to receive monthly dose of Cimzia.  Patient running a fever or have signs/symptoms of infection? No  Patient currently on antibiotics for the treatment of infection? No  Patient have any upcoming invasive procedures/surgeries? No  Objective: CMP     Component Value Date/Time   NA 141 05/07/2020 1535   K 4.2 05/07/2020 1535   CL 104 05/07/2020 1535   CO2 28 05/07/2020 1535   GLUCOSE 82 05/07/2020 1535   BUN 17 05/07/2020 1535   CREATININE 0.78 05/07/2020 1535   CALCIUM 8.9 05/07/2020 1535   PROT 5.9 (L) 05/07/2020 1535   ALBUMIN 4.1 01/30/2020 1149   AST 16 05/07/2020 1535   AST 18 01/30/2020 1149   ALT 15 05/07/2020 1535   ALT 19 01/30/2020 1149   ALKPHOS 52 01/30/2020 1149   BILITOT 0.3 05/07/2020 1535   BILITOT 0.2 (L) 01/30/2020 1149   GFRNONAA 99 05/07/2020 1535   GFRAA 115 05/07/2020 1535    CBC    Component Value Date/Time   WBC 6.8 05/07/2020 1535   RBC 4.22 05/07/2020 1535   HGB 13.3 05/07/2020 1535   HGB 12.3 06/10/2018 1446   HCT 41.0 05/07/2020 1535   PLT 341 05/07/2020 1535   PLT 229 06/10/2018 1446   MCV 97.2 05/07/2020 1535   MCH 31.5 05/07/2020 1535   MCHC 32.4 05/07/2020 1535   RDW 12.4 05/07/2020 1535   LYMPHSABS 1,618 05/07/2020 1535   MONOABS 0.4 01/30/2020 1149   EOSABS 537 (H) 05/07/2020 1535   BASOSABS 41 05/07/2020 1535    Baseline Immunosuppressant Therapy Labs TB GOLD Quantiferon TB Gold Latest Ref Rng & Units 05/07/2020  Quantiferon TB Gold Plus NEGATIVE NEGATIVE   Hepatitis Panel Negative 12/31/15  HIV Lab Results  Component Value Date   HIV Non Reactive 07/07/2019   Immunoglobulins Negative 12/31/15  SPEP Negative 12/31/15  G6PD No results found for: G6PDH TPMT No results found for: TPMT   Chest x-ray: no active cardiopulmonary disease 06/29/2016  Assessment/Plan:  Administrations This Visit    Certolizumab Pegol KIT 400 mg    Admin Date 07/12/2020  Action Given Dose 400 mg Route Subcutaneous Administered By Mariella Saa C, RPH-CPP          Patient tolerated injection without issue.   Appointment for next injection pending patient obtaining school schedule. Donney Dice will call the office and schedule once received. Patient due for CBC/CMP in August and TB gold May 2022.  Patient is to call and reschedule appointment if running a fever with signs/symptoms of infection, on antibiotics for active infection or has an upcoming invasive procedure.  All questions encouraged and answered.  Instructed patient to call with any further questions or concerns.   Mariella Saa, PharmD, [place], CPP Clinical Specialty Pharmacist (Rheumatology and Pulmonology)  07/12/2020 11:11 AM

## 2020-07-23 ENCOUNTER — Telehealth: Payer: Self-pay | Admitting: Hematology

## 2020-07-23 NOTE — Telephone Encounter (Signed)
Rescheduled 08/12 to 08/30 per scheduling change, called patient and left a voicemail.

## 2020-07-27 ENCOUNTER — Telehealth: Payer: Self-pay | Admitting: Pharmacy Technician

## 2020-07-27 NOTE — Telephone Encounter (Signed)
Submitted a Prior Authorization request to [employer] for [employer] via Cover My Meds. Will update once we receive a response.   (Key: [id number])

## 2020-07-27 NOTE — Telephone Encounter (Signed)
Received notification from [employer] regarding a prior authorization for [facility]. Authorization has been APPROVED from 07/27/20 to 07/27/21.   Authorization # [id number] Phone # [phone]

## 2020-07-29 ENCOUNTER — Ambulatory Visit: Payer: Medicaid Other | Admitting: Hematology

## 2020-07-29 ENCOUNTER — Other Ambulatory Visit: Payer: Medicaid Other

## 2020-08-12 ENCOUNTER — Other Ambulatory Visit: Payer: Self-pay | Admitting: Rheumatology

## 2020-08-12 DIAGNOSIS — M0579 Rheumatoid arthritis with rheumatoid factor of multiple sites without organ or systems involvement: Secondary | ICD-10-CM

## 2020-08-12 NOTE — Telephone Encounter (Signed)
Last Visit:06/04/2020 Next Visit:11/04/2020 Labs: 05/07/2020 Labs are stable.  TB Gold: 05/07/2020 Neg   Current Dose per office note on 06/04/2020: Cimzia 400 mg every 28 days in office   Okay to refill per Dr. Corliss Skains

## 2020-08-16 ENCOUNTER — Inpatient Hospital Stay: Payer: Medicaid Other | Admitting: Hematology

## 2020-08-16 ENCOUNTER — Inpatient Hospital Stay: Payer: Medicaid Other | Attending: Hematology

## 2020-08-17 ENCOUNTER — Ambulatory Visit: Payer: Medicaid Other

## 2020-08-17 MED FILL — CIMZIA 200 MG/ML SYRN KIT: 2 X 200 | 28 days supply | Qty: 1 | Fill #0

## 2020-08-19 ENCOUNTER — Ambulatory Visit: Payer: Medicaid Other

## 2020-08-26 ENCOUNTER — Other Ambulatory Visit: Payer: Self-pay | Admitting: Rheumatology

## 2020-08-26 ENCOUNTER — Other Ambulatory Visit: Payer: Self-pay | Admitting: Physician Assistant

## 2020-08-26 MED ORDER — PREDNISONE 5 MG PO TABS: ORAL_TABLET | ORAL | 0 refills | Status: DC

## 2020-08-26 NOTE — Telephone Encounter (Signed)
Ok to send in prednisone taper starting at 20 mg tapering by 5 mg every 2 days.  Please advise the patient to update lab work prior to her appointment on 08/30/20.

## 2020-08-26 NOTE — Telephone Encounter (Signed)
Patient advised she is due for labs prior to her appointment on 08/30/2020. Patient states she will come to the office tomorrow afternoon to have her labs updated. Patient advised prescription for prednisone is being sent to the pharmacy.

## 2020-08-26 NOTE — Telephone Encounter (Signed)
Patient called requesting prescription of [facility] [address] in [place].  Patient states her shoulder is painful and she has clinicals tomorrow.  Patient is also requesting a return call to let her know if bloodwork is needed prior to her appointment on Monday, 9/13 with Amber.

## 2020-08-27 ENCOUNTER — Other Ambulatory Visit: Payer: Self-pay

## 2020-08-27 DIAGNOSIS — Z79899 Other long term (current) drug therapy: Secondary | ICD-10-CM

## 2020-08-27 NOTE — Telephone Encounter (Signed)
Last Visit:06/04/2020 Next Visit:11/04/2020 Labs: 05/07/2020 Labs are stable.   Current Dose per office note 06/04/2020: methotrexate 2.5 mg 8 tablets every 7 days DX: Rheumatoid arthritis   Patient should be by the office today to update labs.   Okay to refill MTX?

## 2020-08-28 LAB — COMPLETE METABOLIC PANEL WITH GFR
AG Ratio: 2 (calc) (ref 1.0–2.5)
ALT: 9 U/L (ref 6–29)
AST: 12 U/L (ref 10–30)
Albumin: 3.9 g/dL (ref 3.6–5.1)
Alkaline phosphatase (APISO): 52 U/L (ref 31–125)
BUN: 16 mg/dL (ref 7–25)
CO2: 27 mmol/L (ref 20–32)
Calcium: 8.6 mg/dL (ref 8.6–10.2)
Chloride: 104 mmol/L (ref 98–110)
Creat: 0.8 mg/dL (ref 0.50–1.10)
GFR, Est African American: 111 mL/min/{1.73_m2} (ref >=60)
GFR, Est Non African American: 96 mL/min/{1.73_m2} (ref >=60)
Globulin: 2 g/dL (calc) (ref 1.9–3.7)
Glucose, Bld: 103 mg/dL — ABNORMAL HIGH (ref 65–99)
Potassium: 3.9 mmol/L (ref 3.5–5.3)
Sodium: 140 mmol/L (ref 135–146)
Total Bilirubin: 0.2 mg/dL (ref 0.2–1.2)
Total Protein: 5.9 g/dL — ABNORMAL LOW (ref 6.1–8.1)

## 2020-08-28 LAB — CBC WITH DIFFERENTIAL/PLATELET
Absolute Monocytes: 842 cells/uL (ref 200–950)
Basophils Absolute: 24 cells/uL (ref 0–200)
Basophils Relative: 0.2 %
Eosinophils Absolute: 49 cells/uL (ref 15–500)
Eosinophils Relative: 0.4 %
HCT: 36.6 % (ref 35.0–45.0)
Hemoglobin: 12.1 g/dL (ref 11.7–15.5)
Lymphs Abs: 1708 cells/uL (ref 850–3900)
MCH: 31.3 pg (ref 27.0–33.0)
MCHC: 33.1 g/dL (ref 32.0–36.0)
MCV: 94.8 fL (ref 80.0–100.0)
MPV: 10.9 fL (ref 7.5–12.5)
Monocytes Relative: 6.9 %
Neutro Abs: 9577 cells/uL — ABNORMAL HIGH (ref 1500–7800)
Neutrophils Relative %: 78.5 %
Platelets: 310 10*3/uL (ref 140–400)
RBC: 3.86 10*6/uL (ref 3.80–5.10)
RDW: 13.2 % (ref 11.0–15.0)
Total Lymphocyte: 14 %
WBC: 12.2 10*3/uL — ABNORMAL HIGH (ref 3.8–10.8)

## 2020-08-30 ENCOUNTER — Ambulatory Visit: Payer: Medicaid Other

## 2020-08-30 NOTE — Progress Notes (Signed)
Pharmacy Note  Subjective:   Patient presents to clinic today to receive monthly dose of Cimzia.  Patient is overdue for injection as last injection was 07/12/20.  Patient has history of non-compliance.  Patient running a fever or have signs/symptoms of infection? No  Patient currently on antibiotics for the treatment of infection? No  Patient have any upcoming invasive procedures/surgeries? No  Objective: CMP     Component Value Date/Time   NA 140 08/27/2020 1546   K 3.9 08/27/2020 1546   CL 104 08/27/2020 1546   CO2 27 08/27/2020 1546   GLUCOSE 103 (H) 08/27/2020 1546   BUN 16 08/27/2020 1546   CREATININE 0.80 08/27/2020 1546   CALCIUM 8.6 08/27/2020 1546   PROT 5.9 (L) 08/27/2020 1546   ALBUMIN 4.1 01/30/2020 1149   AST 12 08/27/2020 1546   AST 18 01/30/2020 1149   ALT 9 08/27/2020 1546   ALT 19 01/30/2020 1149   ALKPHOS 52 01/30/2020 1149   BILITOT 0.2 08/27/2020 1546   BILITOT 0.2 (L) 01/30/2020 1149   GFRNONAA 96 08/27/2020 1546   GFRAA 111 08/27/2020 1546    CBC    Component Value Date/Time   WBC 12.2 (H) 08/27/2020 1546   RBC 3.86 08/27/2020 1546   HGB 12.1 08/27/2020 1546   HGB 12.3 06/10/2018 1446   HCT 36.6 08/27/2020 1546   PLT 310 08/27/2020 1546   PLT 229 06/10/2018 1446   MCV 94.8 08/27/2020 1546   MCH 31.3 08/27/2020 1546   MCHC 33.1 08/27/2020 1546   RDW 13.2 08/27/2020 1546   LYMPHSABS 1,708 08/27/2020 1546   MONOABS 0.4 01/30/2020 1149   EOSABS 49 08/27/2020 1546   BASOSABS 24 08/27/2020 1546    Baseline Immunosuppressant Therapy Labs TB GOLD Quantiferon TB Gold Latest Ref Rng & Units 05/07/2020  Quantiferon TB Gold Plus NEGATIVE NEGATIVE   Hepatitis Panel Negative 12/31/15  HIV Lab Results  Component Value Date   HIV Non Reactive 07/07/2019   Immunoglobulins Negative 12/31/15  SPEP Negative 12/31/15  G6PD No results found for: G6PDH TPMT No results found for: TPMT   Chest x-ray: no active cardiopulmonary disease  06/29/2016  Assessment/Plan:   Administrations This Visit    Certolizumab Pegol KIT 400 mg    Admin Date 08/31/2020 Action Given Dose 400 mg Route Subcutaneous Administered By Mariella Saa C, RPH-CPP         Patient tolerated injection without issue.   Appointment for next injection scheduled for 09/30/20 at 1PM.  Patient due for labs in December and TB gold in May 2022.  Patient is to call and reschedule appointment if running a fever with signs/symptoms of infection, on antibiotics for active infection or has an upcoming invasive procedure.  All questions encouraged and answered.  Instructed patient to call with any further questions or concerns.   Mariella Saa, PharmD, [place], CPP Clinical Specialty Pharmacist (Rheumatology and Pulmonology)  08/31/2020 10:09 AM

## 2020-08-30 NOTE — Progress Notes (Signed)
Total protein is borderline elevated but stable.  Rest of CMP WNL.  WBC count is mildly elevated. Absolute neutrophils are mildly elevated. Rest of CBC WNL.   She is currently taking a prednisone taper.

## 2020-08-31 ENCOUNTER — Ambulatory Visit (INDEPENDENT_AMBULATORY_CARE_PROVIDER_SITE_OTHER): Payer: Medicaid Other | Admitting: Pharmacist

## 2020-08-31 ENCOUNTER — Other Ambulatory Visit: Payer: Self-pay

## 2020-08-31 VITALS — BP 144/85 | HR 93

## 2020-08-31 DIAGNOSIS — M0579 Rheumatoid arthritis with rheumatoid factor of multiple sites without organ or systems involvement: Secondary | ICD-10-CM

## 2020-08-31 DIAGNOSIS — M3219 Other organ or system involvement in systemic lupus erythematosus: Secondary | ICD-10-CM

## 2020-08-31 MED ORDER — CERTOLIZUMAB PEGOL 2 X 200 MG/ML ~~LOC~~ KIT
400.0000 mg | PACK | Freq: Once | SUBCUTANEOUS | Status: AC
  Administered 2020-08-31: 400 mg via SUBCUTANEOUS

## 2020-09-03 ENCOUNTER — Other Ambulatory Visit: Payer: Self-pay | Admitting: Rheumatology

## 2020-09-03 DIAGNOSIS — M0579 Rheumatoid arthritis with rheumatoid factor of multiple sites without organ or systems involvement: Secondary | ICD-10-CM

## 2020-09-03 NOTE — Telephone Encounter (Signed)
Last Visit: 06/04/2020 Next Visit: 11/04/2020 Labs: 08/27/2020 Total protein is borderline elevated but stable. Rest of CMP WNL. WBC count is mildly elevated. Absolute neutrophils are mildly elevated. Rest of CBC WNL. She is currently taking a prednisone taper.  TB Gold: 05/07/2020 Negative  Current Dose per office note 06/04/2020: Cimzia 400 mg every 28 days in office  AP:[id number] arthritis with rheumatoid factor of multiple sites without organ or systems involvement  Okay to refill Cimzia?

## 2020-09-21 ENCOUNTER — Telehealth: Payer: Self-pay

## 2020-09-21 MED ORDER — PREDNISONE 5 MG PO TABS
ORAL_TABLET | ORAL | 0 refills | Status: DC
Start: 2020-09-21 — End: 2020-12-09

## 2020-09-21 NOTE — Telephone Encounter (Signed)
Patient scheduled for an appointment to discuss treatment options on 09/28/2020.

## 2020-09-21 NOTE — Telephone Encounter (Signed)
Last Cimzia was 08/31/2020 and next one scheduled for 09/30/2020. Patient is also on MTX 8 tabs weekly.

## 2020-09-21 NOTE — Telephone Encounter (Signed)
Please schedule an office visit to discuss other treatment options. She has been experiencing frequent flares in multiple joints over the past several months despite no gaps in therapy.  She may need to be switched to an infusion.  Ok to send in prednisone taper starting at 20 mg tapering by 5 mg every 2 days.

## 2020-09-21 NOTE — Telephone Encounter (Signed)
Patient called stating her right shoulder is painful and is worried that it will get worse.  Patient states she has clinicals tomorrow and requesting prescription of [facility] [address] in [place].

## 2020-09-23 NOTE — Progress Notes (Deleted)
Office Visit Note  Patient: Hayley Burke             Date of Birth: 06-19-1986           MRN: [id number]             PCP: Cora Collum, DO Referring: Cora Collum, DO Visit Date: 09/28/2020 Occupation: @GUAROCC @  Subjective:  No chief complaint on file.   History of Present Illness: Hayley Burke is a 34 y.o. female ***   Activities of Daily Living:  Patient reports morning stiffness for *** {minute/hour:19697}.   Patient {ACTIONS;DENIES/REPORTS:21021675::"Denies"} nocturnal pain.  Difficulty dressing/grooming: {ACTIONS;DENIES/REPORTS:21021675::"Denies"} Difficulty climbing stairs: {ACTIONS;DENIES/REPORTS:21021675::"Denies"} Difficulty getting out of chair: {ACTIONS;DENIES/REPORTS:21021675::"Denies"} Difficulty using hands for taps, buttons, cutlery, and/or writing: {ACTIONS;DENIES/REPORTS:21021675::"Denies"}  No Rheumatology ROS completed.   PMFS History:  Patient Active Problem List   Diagnosis Date Noted  . Acute otitis externa of left ear 06/09/2020  . Acute otitis media 06/09/2020  . Viral pharyngitis 06/09/2020  . 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 IUD (intrauterine device) in place 07/07/2019  . Pernicious anemia 03/20/2018  . 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 High risk medications (not anticoagulants) long-term use 05/03/2017  . Family history of breast cancer 01/19/2016    Past Medical History:  Diagnosis Date  . Rheumatoid arthritis (HCC)     Family History  Problem Relation Age of Onset  . Sjogren's syndrome Mother   . Breast cancer Mother   . Depression Mother   . Lung cancer Father   . Hyperlipidemia Father   . Drug abuse Brother   . Heart attack Paternal Grandfather    Past Surgical History:  Procedure Laterality Date  . CESAREAN SECTION    . TONSILLECTOMY AND ADENOIDECTOMY     Social History   Social History Narrative  . Not on file   Immunization  History  Administered Date(s) Administered  . Influenza,inj,Quad PF,6+ Mos 11/07/2018  . Pneumococcal Polysaccharide-23 01/06/2016  . Tdap 05/12/2018     Objective: Vital Signs: There were no vitals taken for this visit.   Physical Exam   Musculoskeletal Exam: ***  CDAI Exam: CDAI Score: -- Patient Global: --; Provider Global: -- Swollen: --; Tender: -- Joint Exam 09/28/2020   No joint exam has been documented for this visit   There is currently no information documented on the homunculus. Go to the Rheumatology activity and complete the homunculus joint exam.  Investigation: No additional findings.  Imaging: No results found.  Recent Labs: Lab Results  Component Value Date   WBC 12.2 (H) 08/27/2020   HGB 12.1 08/27/2020   PLT 310 08/27/2020   NA 140 08/27/2020   K 3.9 08/27/2020   CL 104 08/27/2020   CO2 27 08/27/2020   GLUCOSE 103 (H) 08/27/2020   BUN 16 08/27/2020   CREATININE 0.80 08/27/2020   BILITOT 0.2 08/27/2020   ALKPHOS 52 01/30/2020   AST 12 08/27/2020   ALT 9 08/27/2020   PROT 5.9 (L) 08/27/2020   ALBUMIN 4.1 01/30/2020   CALCIUM 8.6 08/27/2020   GFRAA 111 08/27/2020   QFTBGOLDPLUS NEGATIVE 05/07/2020    Speciality Comments: No specialty comments available.  Procedures:  No procedures performed Allergies: Sulfa antibiotics   Assessment / Plan:     Visit Diagnoses: Rheumatoid arthritis with rheumatoid factor of multiple sites without organ or systems involvement (HCC)  High risk medication use - In-office Cimzia and injectable MTX. discuss medication options-rinvoq  or infusion?  Vitamin D deficiency  History of anemia  Trigger finger, right middle finger  Trigger finger, left middle finger  Orders: No orders of the defined types were placed in this encounter.  No orders of the defined types were placed in this encounter.   Face-to-face time spent with patient was *** minutes. Greater than 50% of time was spent in counseling and  coordination of care.  Follow-Up Instructions: No follow-ups on file.   Gearldine Bienenstock, PA-C  Note - This record has been created using Dragon software.  Chart creation errors have been sought, but may not always  have been located. Such creation errors do not reflect on  the standard of medical care.

## 2020-09-24 ENCOUNTER — Telehealth: Payer: Self-pay | Admitting: Pharmacy Technician

## 2020-09-24 NOTE — Telephone Encounter (Signed)
Patient has new Medicaid pharmacy plan. Submitted a Prior Authorization request to [employer] for CIMZIA via Cover My Meds. Will update once we receive a response.   (Key: [id number]) - [id number]

## 2020-09-27 MED FILL — CIMZIA 200 MG/ML SYRN KIT: 2 X 200 | 28 days supply | Qty: 1 | Fill #0

## 2020-09-27 NOTE — Telephone Encounter (Signed)
Received notification from [facility] regarding a prior authorization for [facility]. Authorization has been APPROVED from 09/24/20 to 09/24/21.   Authorization # [id number]

## 2020-09-28 ENCOUNTER — Ambulatory Visit: Payer: Medicaid Other | Admitting: Physician Assistant

## 2020-09-30 ENCOUNTER — Ambulatory Visit: Payer: Medicaid Other

## 2020-10-08 ENCOUNTER — Telehealth: Payer: Self-pay | Admitting: Rheumatology

## 2020-10-08 NOTE — Telephone Encounter (Signed)
Patient scheduled for her Cimzia injection on 10/12/2020 at 9:00 am.   Last Visit: 06/04/2020 Next Visit: 11/04/2020 Labs: 08/27/2020 Total protein is borderline elevated but stable. Rest of CMP WNL. WBC count is mildly elevated. Absolute neutrophils are mildly elevated. Rest of CBC WNL. She is currently taking a prednisone taper.    Current Dose per office note 06/04/2020: methotrexate 2.5 mg 8 tablets every 7 days YZ:[id number] arthritis with rheumatoid factor of multiple sites without organ or systems involvement  Okay to refill MTX?

## 2020-10-08 NOTE — Telephone Encounter (Signed)
Patient left a message stating she needs to schedule Cimzia injection, and request refill of MTX. Patient wanted to know if she could come in today before close for injection. (Pt LMOM at 4:12 pm.)

## 2020-10-11 MED ORDER — METHOTREXATE 2.5 MG PO TABS: 20.0000 mg | ORAL_TABLET | ORAL | 0 refills | Status: DC

## 2020-10-12 ENCOUNTER — Other Ambulatory Visit: Payer: Self-pay

## 2020-10-12 ENCOUNTER — Ambulatory Visit: Payer: Medicaid Other | Admitting: *Deleted

## 2020-10-12 VITALS — BP 140/85

## 2020-10-12 DIAGNOSIS — M0579 Rheumatoid arthritis with rheumatoid factor of multiple sites without organ or systems involvement: Secondary | ICD-10-CM

## 2020-10-12 DIAGNOSIS — M3219 Other organ or system involvement in systemic lupus erythematosus: Secondary | ICD-10-CM

## 2020-10-12 MED ORDER — CERTOLIZUMAB PEGOL 2 X 200 MG/ML ~~LOC~~ KIT
400.0000 mg | PACK | Freq: Once | SUBCUTANEOUS | Status: AC
  Administered 2020-10-12: 400 mg via SUBCUTANEOUS

## 2020-10-12 NOTE — Progress Notes (Signed)
Pharmacy Note  Subjective:   Patient presents to clinic today to receive monthly dose of Cimzia.  Patient running a fever or have signs/symptoms of infection? No  Patient currently on antibiotics for the treatment of infection? No  Patient have any upcoming invasive procedures/surgeries? No  Objective: CMP     Component Value Date/Time   NA 140 08/27/2020 1546   K 3.9 08/27/2020 1546   CL 104 08/27/2020 1546   CO2 27 08/27/2020 1546   GLUCOSE 103 (H) 08/27/2020 1546   BUN 16 08/27/2020 1546   CREATININE 0.80 08/27/2020 1546   CALCIUM 8.6 08/27/2020 1546   PROT 5.9 (L) 08/27/2020 1546   ALBUMIN 4.1 01/30/2020 1149   AST 12 08/27/2020 1546   AST 18 01/30/2020 1149   ALT 9 08/27/2020 1546   ALT 19 01/30/2020 1149   ALKPHOS 52 01/30/2020 1149   BILITOT 0.2 08/27/2020 1546   BILITOT 0.2 (L) 01/30/2020 1149   GFRNONAA 96 08/27/2020 1546   GFRAA 111 08/27/2020 1546    CBC    Component Value Date/Time   WBC 12.2 (H) 08/27/2020 1546   RBC 3.86 08/27/2020 1546   HGB 12.1 08/27/2020 1546   HGB 12.3 06/10/2018 1446   HCT 36.6 08/27/2020 1546   PLT 310 08/27/2020 1546   PLT 229 06/10/2018 1446   MCV 94.8 08/27/2020 1546   MCH 31.3 08/27/2020 1546   MCHC 33.1 08/27/2020 1546   RDW 13.2 08/27/2020 1546   LYMPHSABS 1,708 08/27/2020 1546   MONOABS 0.4 01/30/2020 1149   EOSABS 49 08/27/2020 1546   BASOSABS 24 08/27/2020 1546    Baseline Immunosuppressant Therapy Labs TB GOLD Quantiferon TB Gold Latest Ref Rng & Units 05/07/2020  Quantiferon TB Gold Plus NEGATIVE NEGATIVE   Hepatitis Panel   HIV Lab Results  Component Value Date   HIV Non Reactive 07/07/2019   Immunoglobulins   SPEP Serum Protein Electrophoresis Latest Ref Rng & Units 08/27/2020  Total Protein 6.1 - 8.1 g/dL 5.9(L)   G6PD No results found for: G6PDH TPMT No results found for: TPMT   Chest x-ray: no active cardiopulmonary disease 06/29/2016  Assessment/Plan:   Administrations This Visit     Certolizumab Pegol KIT 400 mg    Admin Date 10/12/2020 Action Given Dose 400 mg Route Subcutaneous Administered By Carole Binning, LPN          Patient tolerated injection well.   Appointment for next injection scheduled for 11/09/2020.  Patient due for labs in December 2021.  Patient is to call and reschedule appointment if running a fever with signs/symptoms of infection, on antibiotics for active infection or has an upcoming invasive procedure.  All questions encouraged and answered.  Instructed patient to call with any further questions or concerns.

## 2020-10-31 NOTE — Progress Notes (Deleted)
Office Visit Note  Patient: Hayley Burke             Date of Birth: [birth year]           MRN: [id number]             PCP: Cora Collum, DO Referring: Cora Collum, DO Visit Date: 11/04/2020 Occupation: @GUAROCC @  Subjective:  No chief complaint on file.   History of Present Illness: Hayley Burke is a 34 y.o. female ***   Activities of Daily Living:  Patient reports morning stiffness for *** {minute/hour:19697}.   Patient {ACTIONS;DENIES/REPORTS:21021675::"Denies"} nocturnal pain.  Difficulty dressing/grooming: {ACTIONS;DENIES/REPORTS:21021675::"Denies"} Difficulty climbing stairs: {ACTIONS;DENIES/REPORTS:21021675::"Denies"} Difficulty getting out of chair: {ACTIONS;DENIES/REPORTS:21021675::"Denies"} Difficulty using hands for taps, buttons, cutlery, and/or writing: {ACTIONS;DENIES/REPORTS:21021675::"Denies"}  No Rheumatology ROS completed.   PMFS History:  Patient Active Problem List   Diagnosis Date Noted  . Acute otitis externa of left ear 06/09/2020  . Acute otitis media 06/09/2020  . Viral pharyngitis 06/09/2020  . 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 IUD (intrauterine device) in place 07/07/2019  . Pernicious anemia 03/20/2018  . 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 High risk medications (not anticoagulants) long-term use 05/03/2017  . Family history of breast cancer 01/19/2016    Past Medical History:  Diagnosis Date  . Rheumatoid arthritis (HCC)     Family History  Problem Relation Age of Onset  . Sjogren's syndrome Mother   . Breast cancer Mother   . Depression Mother   . Lung cancer Father   . Hyperlipidemia Father   . Drug abuse Brother   . Heart attack Paternal Grandfather    Past Surgical History:  Procedure Laterality Date  . CESAREAN SECTION    . TONSILLECTOMY AND ADENOIDECTOMY     Social History   Social History Narrative  . Not on file   Immunization  History  Administered Date(s) Administered  . Influenza,inj,Quad PF,6+ Mos 11/07/2018  . Pneumococcal Polysaccharide-23 01/06/2016  . Tdap 05/12/2018     Objective: Vital Signs: There were no vitals taken for this visit.   Physical Exam   Musculoskeletal Exam: ***  CDAI Exam: CDAI Score: -- Patient Global: --; Provider Global: -- Swollen: --; Tender: -- Joint Exam 11/04/2020   No joint exam has been documented for this visit   There is currently no information documented on the homunculus. Go to the Rheumatology activity and complete the homunculus joint exam.  Investigation: No additional findings.  Imaging: No results found.  Recent Labs: Lab Results  Component Value Date   WBC 12.2 (H) 08/27/2020   HGB 12.1 08/27/2020   PLT 310 08/27/2020   NA 140 08/27/2020   K 3.9 08/27/2020   CL 104 08/27/2020   CO2 27 08/27/2020   GLUCOSE 103 (H) 08/27/2020   BUN 16 08/27/2020   CREATININE 0.80 08/27/2020   BILITOT 0.2 08/27/2020   ALKPHOS 52 01/30/2020   AST 12 08/27/2020   ALT 9 08/27/2020   PROT 5.9 (L) 08/27/2020   ALBUMIN 4.1 01/30/2020   CALCIUM 8.6 08/27/2020   GFRAA 111 08/27/2020   QFTBGOLDPLUS NEGATIVE 05/07/2020    Speciality Comments: No specialty comments available.  Procedures:  No procedures performed Allergies: Sulfa antibiotics   Assessment / Plan:     Visit Diagnoses: No diagnosis found.  Orders: No orders of the defined types were placed in this encounter.  No orders of the defined types were placed in this encounter.  Face-to-face time spent with patient was *** minutes. Greater than 50% of time was spent in counseling and coordination of care.  Follow-Up Instructions: No follow-ups on file.   Bo Merino, MD  Note - This record has been created using Editor, commissioning.  Chart creation errors have been sought, but may not always  have been located. Such creation errors do not reflect on  the standard of medical care.

## 2020-11-04 ENCOUNTER — Ambulatory Visit: Payer: Medicaid Other | Admitting: Rheumatology

## 2020-11-04 DIAGNOSIS — L739 Follicular disorder, unspecified: Secondary | ICD-10-CM

## 2020-11-04 DIAGNOSIS — M0579 Rheumatoid arthritis with rheumatoid factor of multiple sites without organ or systems involvement: Secondary | ICD-10-CM

## 2020-11-04 DIAGNOSIS — Z862 Personal history of diseases of the blood and blood-forming organs and certain disorders involving the immune mechanism: Secondary | ICD-10-CM

## 2020-11-04 DIAGNOSIS — Z79899 Other long term (current) drug therapy: Secondary | ICD-10-CM

## 2020-11-04 DIAGNOSIS — E559 Vitamin D deficiency, unspecified: Secondary | ICD-10-CM

## 2020-11-09 ENCOUNTER — Other Ambulatory Visit: Payer: Self-pay | Admitting: Physician Assistant

## 2020-11-09 ENCOUNTER — Ambulatory Visit: Payer: Medicaid Other

## 2020-11-09 DIAGNOSIS — M0579 Rheumatoid arthritis with rheumatoid factor of multiple sites without organ or systems involvement: Secondary | ICD-10-CM

## 2020-11-09 MED FILL — CIMZIA 200 MG/ML SYRN KIT: 2 X 200 | 28 days supply | Qty: 1 | Fill #0

## 2020-11-09 NOTE — Telephone Encounter (Signed)
Last Visit: 06/04/2020 Next Visit: 11/04/2020 Labs: 08/27/2020 Total protein is borderline elevated but stable. Rest of CMP WNL. WBC count is mildly elevated. Absolute neutrophils are mildly elevated. Rest of CBC WNL. She is currently taking a prednisone taper.  TB Gold: 05/07/2020 Negative  Current Dose per office note 06/04/2020: Cimzia 400 mg every 28 days in office  NT:[id number] arthritis with rheumatoid factor of multiple sites without organ or systems involvement  Okay to refill Cimzia?

## 2020-11-10 ENCOUNTER — Ambulatory Visit (INDEPENDENT_AMBULATORY_CARE_PROVIDER_SITE_OTHER): Payer: Medicaid Other | Admitting: *Deleted

## 2020-11-10 ENCOUNTER — Other Ambulatory Visit: Payer: Self-pay

## 2020-11-10 VITALS — BP 130/88 | HR 116

## 2020-11-10 DIAGNOSIS — M0579 Rheumatoid arthritis with rheumatoid factor of multiple sites without organ or systems involvement: Secondary | ICD-10-CM | POA: Diagnosis not present

## 2020-11-10 MED ORDER — CERTOLIZUMAB PEGOL 2 X 200 MG/ML ~~LOC~~ KIT
400.0000 mg | PACK | Freq: Once | SUBCUTANEOUS | Status: AC
  Administered 2020-11-10: 400 mg via SUBCUTANEOUS

## 2020-11-10 NOTE — Progress Notes (Signed)
Pharmacy Note  Subjective:   Patient presents to clinic today to receive monthly dose of Cimzia.  Patient running a fever or have signs/symptoms of infection? No  Patient currently on antibiotics for the treatment of infection? No  Patient have any upcoming invasive procedures/surgeries? No  Objective: CMP     Component Value Date/Time   NA 140 08/27/2020 1546   K 3.9 08/27/2020 1546   CL 104 08/27/2020 1546   CO2 27 08/27/2020 1546   GLUCOSE 103 (H) 08/27/2020 1546   BUN 16 08/27/2020 1546   CREATININE 0.80 08/27/2020 1546   CALCIUM 8.6 08/27/2020 1546   PROT 5.9 (L) 08/27/2020 1546   ALBUMIN 4.1 01/30/2020 1149   AST 12 08/27/2020 1546   AST 18 01/30/2020 1149   ALT 9 08/27/2020 1546   ALT 19 01/30/2020 1149   ALKPHOS 52 01/30/2020 1149   BILITOT 0.2 08/27/2020 1546   BILITOT 0.2 (L) 01/30/2020 1149   GFRNONAA 96 08/27/2020 1546   GFRAA 111 08/27/2020 1546    CBC    Component Value Date/Time   WBC 12.2 (H) 08/27/2020 1546   RBC 3.86 08/27/2020 1546   HGB 12.1 08/27/2020 1546   HGB 12.3 06/10/2018 1446   HCT 36.6 08/27/2020 1546   PLT 310 08/27/2020 1546   PLT 229 06/10/2018 1446   MCV 94.8 08/27/2020 1546   MCH 31.3 08/27/2020 1546   MCHC 33.1 08/27/2020 1546   RDW 13.2 08/27/2020 1546   LYMPHSABS 1,708 08/27/2020 1546   MONOABS 0.4 01/30/2020 1149   EOSABS 49 08/27/2020 1546   BASOSABS 24 08/27/2020 1546    Baseline Immunosuppressant Therapy Labs TB GOLD Quantiferon TB Gold Latest Ref Rng & Units 05/07/2020  Quantiferon TB Gold Plus NEGATIVE NEGATIVE   Hepatitis Panel   HIV Lab Results  Component Value Date   HIV Non Reactive 07/07/2019   Immunoglobulins   SPEP Serum Protein Electrophoresis Latest Ref Rng & Units 08/27/2020  Total Protein 6.1 - 8.1 g/dL 5.9(L)   G6PD No results found for: G6PDH TPMT No results found for: TPMT   Chest x-ray: no active cardiopulmonary disease 06/29/2016  Assessment/Plan:   Administrations This Visit     Certolizumab Pegol KIT 400 mg    Admin Date 11/10/2020 Action Given Dose 400 mg Route Subcutaneous Administered By Carole Binning, LPN          Patient tolerated injection well.   Appointment for next injection scheduled for 12/08/2020.  Patient due for labs in December 2021.  Patient is to call and reschedule appointment if running a fever with signs/symptoms of infection, on antibiotics for active infection or has an upcoming invasive procedure.  All questions encouraged and answered.  Instructed patient to call with any further questions or concerns.

## 2020-11-10 NOTE — Telephone Encounter (Signed)
Spoke with patient and scheduled her for her Cimzia injection today 11/10/2020.

## 2020-11-24 ENCOUNTER — Ambulatory Visit: Payer: Medicaid Other | Admitting: Physician Assistant

## 2020-12-07 ENCOUNTER — Other Ambulatory Visit: Payer: Self-pay | Admitting: Rheumatology

## 2020-12-07 ENCOUNTER — Other Ambulatory Visit: Payer: Self-pay | Admitting: Physician Assistant

## 2020-12-07 DIAGNOSIS — M0579 Rheumatoid arthritis with rheumatoid factor of multiple sites without organ or systems involvement: Secondary | ICD-10-CM

## 2020-12-07 MED FILL — CIMZIA 200 MG/ML SYRN KIT: 2 X 200 | 28 days supply | Qty: 1 | Fill #0

## 2020-12-07 NOTE — Telephone Encounter (Signed)
Last Visit:06/04/2020 Next Visit:11/04/2020 Labs: 08/27/2020 Total protein is borderline elevated but stable. Rest of CMP WNL. WBC count is mildly elevated. Absolute neutrophils are mildly elevated. Rest of CBC WNL.  TB Gold: 05/07/2020 Neg   Current Dose per office note on 06/04/2020: Cimzia 400 mg every 28 days Rx:  Rheumatoid arthritis with rheumatoid factor of multiple sites without organ or systems involvement   Patient to update labs at appointment on 12/09/2020.  Okay to refill Cimzia?

## 2020-12-08 ENCOUNTER — Ambulatory Visit: Payer: Medicaid Other

## 2020-12-09 ENCOUNTER — Ambulatory Visit (INDEPENDENT_AMBULATORY_CARE_PROVIDER_SITE_OTHER): Payer: Medicaid Other | Admitting: Rheumatology

## 2020-12-09 ENCOUNTER — Encounter: Payer: Self-pay | Admitting: Rheumatology

## 2020-12-09 ENCOUNTER — Other Ambulatory Visit: Payer: Self-pay

## 2020-12-09 VITALS — BP 127/88 | HR 111 | Resp 12 | Ht 65.0 in | Wt 128.8 lb

## 2020-12-09 DIAGNOSIS — R2241 Localized swelling, mass and lump, right lower limb: Secondary | ICD-10-CM | POA: Diagnosis not present

## 2020-12-09 DIAGNOSIS — Z79899 Other long term (current) drug therapy: Secondary | ICD-10-CM

## 2020-12-09 DIAGNOSIS — M0579 Rheumatoid arthritis with rheumatoid factor of multiple sites without organ or systems involvement: Secondary | ICD-10-CM | POA: Diagnosis not present

## 2020-12-09 DIAGNOSIS — E559 Vitamin D deficiency, unspecified: Secondary | ICD-10-CM

## 2020-12-09 DIAGNOSIS — Z7189 Other specified counseling: Secondary | ICD-10-CM

## 2020-12-09 DIAGNOSIS — Z862 Personal history of diseases of the blood and blood-forming organs and certain disorders involving the immune mechanism: Secondary | ICD-10-CM

## 2020-12-09 MED ORDER — CERTOLIZUMAB PEGOL 2 X 200 MG/ML ~~LOC~~ KIT
400.0000 mg | PACK | Freq: Once | SUBCUTANEOUS | Status: AC
  Administered 2020-12-09: 400 mg via SUBCUTANEOUS

## 2020-12-09 NOTE — Patient Instructions (Addendum)
Standing Labs We placed an order today for your standing lab work.   Please have your standing labs drawn in March and every 3 months  If possible, please have your labs drawn 2 weeks prior to your appointment so that the provider can discuss your results at your appointment.  We have open lab daily Monday through Thursday from 8:30-12:30 PM and 1:30-4:30 PM and Friday from 8:30-12:30 PM and 1:30-4:00 PM at the office of Dr. Dushawn Pusey, [facility].   Please be advised, patients with office appointments requiring lab work will take precedents over walk-in lab work.  If possible, please come for your lab work on Monday and Friday afternoons, as you may experience shorter wait times. The office is located at [address], [address], , [place] [address] No appointment is necessary.   Labs are drawn by [facility]. Please bring your co-pay at the time of your lab draw.  You may receive a bill from [facility] for your lab work.  If you wish to have your labs drawn at another location, please call the office 24 hours in advance to send orders.  If you have any questions regarding directions or hours of operation,  please call [phone].   As a reminder, please drink plenty of water prior to coming for your lab work. Thanks!   COVID-19 vaccine recommendations:   COVID-19 vaccine is recommended for everyone (unless you are allergic to a vaccine component), even if you are on a medication that suppresses your immune system.   If you are on Methotrexate, Cellcept (mycophenolate), Rinvoq, Xeljanz, and Olumiant- hold the medication for 1 week after each vaccine. Hold Methotrexate for 2 weeks after the single dose COVID-19 vaccine.   Do not take Tylenol or any anti-inflammatory medications (NSAIDs) 24 hours prior to the COVID-19 vaccination.   There is no direct evidence about the efficacy of the COVID-19 vaccine in individuals who are on medications that suppress the immune  system.   Even if you are fully vaccinated, and you are on any medications that suppress your immune system, please continue to wear a mask, maintain at least six feet social distance and practice hand hygiene.   If you develop a COVID-19 infection, please contact your PCP or our office to determine if you need monoclonal antibody infusion.  The booster vaccine is now available for immunocompromised patients.   Please see the following web sites for updated information.   [email or web address]     

## 2020-12-09 NOTE — Progress Notes (Signed)
Pharmacy Note  Subjective:   Patient presents to clinic today to receive monthly dose of Cimzia.  Patient running a fever or have signs/symptoms of infection? No  Patient currently on antibiotics for the treatment of infection? No  Patient have any upcoming invasive procedures/surgeries? No  Objective: CMP     Component Value Date/Time   NA 140 08/27/2020 1546   K 3.9 08/27/2020 1546   CL 104 08/27/2020 1546   CO2 27 08/27/2020 1546   GLUCOSE 103 (H) 08/27/2020 1546   BUN 16 08/27/2020 1546   CREATININE 0.80 08/27/2020 1546   CALCIUM 8.6 08/27/2020 1546   PROT 5.9 (L) 08/27/2020 1546   ALBUMIN 4.1 01/30/2020 1149   AST 12 08/27/2020 1546   AST 18 01/30/2020 1149   ALT 9 08/27/2020 1546   ALT 19 01/30/2020 1149   ALKPHOS 52 01/30/2020 1149   BILITOT 0.2 08/27/2020 1546   BILITOT 0.2 (L) 01/30/2020 1149   GFRNONAA 96 08/27/2020 1546   GFRAA 111 08/27/2020 1546    CBC    Component Value Date/Time   WBC 12.2 (H) 08/27/2020 1546   RBC 3.86 08/27/2020 1546   HGB 12.1 08/27/2020 1546   HGB 12.3 06/10/2018 1446   HCT 36.6 08/27/2020 1546   PLT 310 08/27/2020 1546   PLT 229 06/10/2018 1446   MCV 94.8 08/27/2020 1546   MCH 31.3 08/27/2020 1546   MCHC 33.1 08/27/2020 1546   RDW 13.2 08/27/2020 1546   LYMPHSABS 1,708 08/27/2020 1546   MONOABS 0.4 01/30/2020 1149   EOSABS 49 08/27/2020 1546   BASOSABS 24 08/27/2020 1546    Baseline Immunosuppressant Therapy Labs TB GOLD Quantiferon TB Gold Latest Ref Rng & Units 05/07/2020  Quantiferon TB Gold Plus NEGATIVE NEGATIVE   Hepatitis Panel   HIV Lab Results  Component Value Date   HIV Non Reactive 07/07/2019   Immunoglobulins   SPEP Serum Protein Electrophoresis Latest Ref Rng & Units 08/27/2020  Total Protein 6.1 - 8.1 g/dL 5.9(L)   G6PD No results found for: G6PDH TPMT No results found for: TPMT   Chest x-ray:no active cardiopulmonary disease 06/29/2016  Assessment/Plan:   Administrations This Visit     Certolizumab Pegol KIT 400 mg    Admin Date 12/09/2020 Action Given Dose 400 mg Route Subcutaneous Administered By Carole Binning, LPN          Patient tolerated injection well.    Appointment for next injection scheduled for 01/06/2021.  Patient due for labs in March 2022.  Patient is to call and reschedule appointment if running a fever with signs/symptoms of infection, on antibiotics for active infection or has an upcoming invasive procedure.  All questions encouraged and answered.  Instructed patient to call with any further questions or concerns.

## 2020-12-09 NOTE — Progress Notes (Signed)
Office Visit Note  Patient: Hayley Burke             Date of Birth: 26-Nov-1986           MRN: [id number]             PCP: Shary Key, DO Referring: Shary Key, DO Visit Date: 12/09/2020 Occupation: _0 @  Subjective:  Other (Right great toe )   History of Present Illness: Hayley Burke is a 34 y.o. female with seropositive rheumatoid arthritis.  She has been on Cimzia for almost 2 years now.  She is doing well on Cimzia and methotrexate combination.  She states if she delays her Cimzia dose she starts experiencing increased joint pain.  She is experiencing some stiffness in her hands and wrist joints in the morning.  She has noticed a knot on her right great toe.  Had none of the other joints are painful.  She has been tolerating medications well.  Activities of Daily Living:  Patient reports morning stiffness for 1-2 hours.   Patient Denies nocturnal pain.  Difficulty dressing/grooming: Denies Difficulty climbing stairs: Denies Difficulty getting out of chair: Denies Difficulty using hands for taps, buttons, cutlery, and/or writing: Denies  Review of Systems  Constitutional: Negative for fatigue.  HENT: Negative for mouth sores, mouth dryness and nose dryness.   Eyes: Positive for dryness. Negative for pain, redness and itching.  Respiratory: Negative for shortness of breath and difficulty breathing.   Cardiovascular: Negative for chest pain and palpitations.  Gastrointestinal: Negative for blood in stool, constipation and diarrhea.  Endocrine: Negative for increased urination.  Genitourinary: Negative for difficulty urinating.  Musculoskeletal: Positive for arthralgias, joint pain, myalgias, morning stiffness and myalgias. Negative for joint swelling and muscle tenderness.  Skin: Negative for color change, rash and redness.  Allergic/Immunologic: Negative for susceptible to infections.  Neurological: Positive for headaches. Negative for dizziness, numbness,  memory loss and weakness.  Hematological: Positive for bruising/bleeding tendency.  Psychiatric/Behavioral: Negative for confusion and sleep disturbance.    PMFS History:  Patient Active Problem List   Diagnosis Date Noted  . Acute otitis externa of left ear 06/09/2020  . Acute otitis media 06/09/2020  . Viral pharyngitis 06/09/2020  . 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 IUD (intrauterine device) in place 07/07/2019  . Pernicious anemia 03/20/2018  . Rheumatoid arthritis with rheumatoid factor of multiple sites without organ or systems involvement ([place]) 05/03/2017  . High risk medications (not anticoagulants) long-term use 05/03/2017  . Family history of breast cancer 01/19/2016    Past Medical History:  Diagnosis Date  . Rheumatoid arthritis ([place])     Family History  Problem Relation Age of Onset  . Sjogren's syndrome Mother   . Breast cancer Mother   . Depression Mother   . Lung cancer Father   . Hyperlipidemia Father   . Drug abuse Brother   . Heart attack Paternal Grandfather    Past Surgical History:  Procedure Laterality Date  . CESAREAN SECTION    . TONSILLECTOMY AND ADENOIDECTOMY     Social History   Social History Narrative  . Not on file   Immunization History  Administered Date(s) Administered  . Influenza,inj,Quad PF,6+ Mos 11/07/2018  . PFIZER SARS-COV-2 Vaccination 05/11/2020, 06/01/2020  . Pneumococcal Polysaccharide-23 01/06/2016  . Tdap 05/12/2018     Objective: Vital Signs: BP 127/88 (BP Location: Left Arm, Patient Position: Sitting, Cuff Size: Small)   Pulse (!) 111   Resp 12   Ht 5'  5" (1.651 m)   Wt 128 lb 12.8 oz (58.4 kg)   BMI 21.43 kg/m    Physical Exam Vitals and nursing note reviewed.  Constitutional:      Appearance: She is well-developed and well-nourished.  HENT:     Head: Normocephalic and atraumatic.  Eyes:     Extraocular Movements: EOM normal.     Conjunctiva/sclera: Conjunctivae normal.  Cardiovascular:     Rate  and Rhythm: Normal rate and regular rhythm.     Pulses: Intact distal pulses.     Heart sounds: Normal heart sounds.  Pulmonary:     Effort: Pulmonary effort is normal.     Breath sounds: Normal breath sounds.  Abdominal:     General: Bowel sounds are normal.     Palpations: Abdomen is soft.  Musculoskeletal:     Cervical back: Normal range of motion.  Lymphadenopathy:     Cervical: No cervical adenopathy.  Skin:    General: Skin is warm and dry.     Capillary Refill: Capillary refill takes less than 2 seconds.     Comments: Nodule noted under the right great toe of rubbery consistency most likely rheumatoid nodule.  Neurological:     Mental Status: She is alert and oriented to person, place, and time.  Psychiatric:        Mood and Affect: Mood and affect normal.        Behavior: Behavior normal.      Musculoskeletal Exam: C-spine was in good range of motion.  Shoulder joints and elbow joints with good range of motion.  She has limitation in range of motion of her bilateral wrist joints with synovial thickening.  Synovial thickening was noted over MCP joints.  No synovitis was noted.  Mild PIP and DIP prominence was noted.  Hip joints and knee joints with good range of motion.  She had no tenderness over ankles or MTPs.  CDAI Exam: CDAI Score: 0.2  Patient Global: 1 mm; Provider Global: 1 mm Swollen: 0 ; Tender: 0  Joint Exam 12/09/2020   No joint exam has been documented for this visit   There is currently no information documented on the homunculus. Go to the Rheumatology activity and complete the homunculus joint exam.  Investigation: No additional findings.  Imaging: No results found.  Recent Labs: Lab Results  Component Value Date   WBC 12.2 (H) 08/27/2020   HGB 12.1 08/27/2020   PLT 310 08/27/2020   NA 140 08/27/2020   K 3.9 08/27/2020   CL 104 08/27/2020   CO2 27 08/27/2020   GLUCOSE 103 (H) 08/27/2020   BUN 16 08/27/2020   CREATININE 0.80 08/27/2020    BILITOT 0.2 08/27/2020   ALKPHOS 52 01/30/2020   AST 12 08/27/2020   ALT 9 08/27/2020   PROT 5.9 (L) 08/27/2020   ALBUMIN 4.1 01/30/2020   CALCIUM 8.6 08/27/2020   GFRAA 111 08/27/2020   QFTBGOLDPLUS NEGATIVE 05/07/2020    Speciality Comments: No specialty comments available.  Procedures:  No procedures performed Allergies: Sulfa antibiotics   Assessment / Plan:     Visit Diagnoses: Rheumatoid arthritis with rheumatoid factor of multiple sites without organ or systems involvement ([place]) -patient has severe erosive rheumatoid arthritis.  She has been doing well on combination of Cimzia and methotrexate.  She had no synovitis on my examination today.  She gives history of some morning stiffness.  X-rays obtained in January 2001 did not show any radiographic progression.  Plan: Certolizumab Pegol KIT 400  mg subcutaneous was given today.  High risk medication use - Cimzia 400 mg every 28 days in office started on 08/21/18, methotrexate 2.5 mg 8 tablets every 7 days, and folic acid 1 mg 2 tablets daily - Plan: CBC with Differential/Platelet, COMPLETE METABOLIC PANEL WITH GFR today and then every 3 months.  TB gold is due in May 2022.  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  I offered cortisone injection which she declined.  Vitamin D deficiency-she states she stopped vitamin D and resumed recently.  We will check vitamin D with her next labs.  Folliculitis-resolved.  History of anemia  Educated about COVID-19 virus infection-she is fully vaccinated against COVID-19.  She has been advised to get a booster.  Use of mask, social distancing and hand hygiene was discussed.  She was also advised to delay methotrexate by 1 week after the procedure.  Instructions were placed in the AVS.  Orders: Orders Placed This Encounter  Procedures  . CBC with Differential/Platelet  . COMPLETE METABOLIC PANEL WITH GFR   Meds ordered this encounter  Medications  . Certolizumab  Pegol KIT 400 mg     Follow-Up Instructions: Return in 3 months (on 03/09/2021) for Cimzia on 01/06/2021, Rheumatoid arthritis.   Bo Merino, MD  Note - This record has been created using Editor, commissioning.  Chart creation errors have been sought, but may not always  have been located. Such creation errors do not reflect on  the standard of medical care.

## 2020-12-10 LAB — COMPLETE METABOLIC PANEL WITH GFR
AG Ratio: 2.1 (calc) (ref 1.0–2.5)
ALT: 15 U/L (ref 6–29)
AST: 17 U/L (ref 10–30)
Albumin: 4 g/dL (ref 3.6–5.1)
Alkaline phosphatase (APISO): 48 U/L (ref 31–125)
BUN: 17 mg/dL (ref 7–25)
CO2: 28 mmol/L (ref 20–32)
Calcium: 8.9 mg/dL (ref 8.6–10.2)
Chloride: 104 mmol/L (ref 98–110)
Creat: 0.81 mg/dL (ref 0.50–1.10)
GFR, Est African American: 109 mL/min/{1.73_m2} (ref >=60)
GFR, Est Non African American: 94 mL/min/{1.73_m2} (ref >=60)
Globulin: 1.9 g/dL (calc) (ref 1.9–3.7)
Glucose, Bld: 86 mg/dL (ref 65–99)
Potassium: 4.6 mmol/L (ref 3.5–5.3)
Sodium: 141 mmol/L (ref 135–146)
Total Bilirubin: 0.3 mg/dL (ref 0.2–1.2)
Total Protein: 5.9 g/dL — ABNORMAL LOW (ref 6.1–8.1)

## 2020-12-10 LAB — CBC WITH DIFFERENTIAL/PLATELET
Absolute Monocytes: 482 cells/uL (ref 200–950)
Basophils Absolute: 40 cells/uL (ref 0–200)
Basophils Relative: 0.6 %
Eosinophils Absolute: 845 cells/uL — ABNORMAL HIGH (ref 15–500)
Eosinophils Relative: 12.8 %
HCT: 39.8 % (ref 35.0–45.0)
Hemoglobin: 13.3 g/dL (ref 11.7–15.5)
Lymphs Abs: 1650 cells/uL (ref 850–3900)
MCH: 31.8 pg (ref 27.0–33.0)
MCHC: 33.4 g/dL (ref 32.0–36.0)
MCV: 95.2 fL (ref 80.0–100.0)
MPV: 11.6 fL (ref 7.5–12.5)
Monocytes Relative: 7.3 %
Neutro Abs: 3584 cells/uL (ref 1500–7800)
Neutrophils Relative %: 54.3 %
Platelets: 268 10*3/uL (ref 140–400)
RBC: 4.18 10*6/uL (ref 3.80–5.10)
RDW: 12.8 % (ref 11.0–15.0)
Total Lymphocyte: 25 %
WBC: 6.6 10*3/uL (ref 3.8–10.8)

## 2020-12-13 NOTE — Progress Notes (Signed)
CBC is normal.  CMP shows low protein due to chronic disease.  No change in treatment required.

## 2021-01-03 ENCOUNTER — Other Ambulatory Visit: Payer: Self-pay | Admitting: Rheumatology

## 2021-01-03 DIAGNOSIS — M0579 Rheumatoid arthritis with rheumatoid factor of multiple sites without organ or systems involvement: Secondary | ICD-10-CM

## 2021-01-03 NOTE — Telephone Encounter (Signed)
Last Visit: 12/09/2020,  Next Visit: Message sent to front desk to schedule appt. Labs: 12/09/2020, CBC is normal. CMP shows low protein due to chronic disease.  TB Gold: 05/07/2020, negative  Current Dose per office note 12/09/2020,  Cimzia 400 mg every 28 days in office started on 08/21/18 [id number] arthritis with rheumatoid factor of multiple sites without organ or systems involvement   Okay to refill Cimzia?

## 2021-01-03 NOTE — Telephone Encounter (Signed)
Please call patient to schedule f/u appt. Sue Lush -please call patient to schedule Cimzia appt.    Return in 3 months (on 03/09/2021) for Cimzia on 01/06/2021, Rheumatoid arthritis.

## 2021-01-04 NOTE — Telephone Encounter (Signed)
Spoke with patient and scheduled patient for her Cimzia injection on 01/06/2021. Patient scheduled for follow up visit on 03/10/2021.

## 2021-01-05 MED FILL — CIMZIA 200 MG/ML SYRN KIT: 2 X 200 | 28 days supply | Qty: 1 | Fill #0

## 2021-01-06 ENCOUNTER — Ambulatory Visit (INDEPENDENT_AMBULATORY_CARE_PROVIDER_SITE_OTHER): Payer: Medicaid Other | Admitting: *Deleted

## 2021-01-06 ENCOUNTER — Other Ambulatory Visit: Payer: Self-pay

## 2021-01-06 VITALS — BP 139/73 | HR 117

## 2021-01-06 DIAGNOSIS — M0579 Rheumatoid arthritis with rheumatoid factor of multiple sites without organ or systems involvement: Secondary | ICD-10-CM | POA: Diagnosis not present

## 2021-01-06 MED ORDER — CERTOLIZUMAB PEGOL 2 X 200 MG/ML ~~LOC~~ KIT
400.0000 mg | PACK | Freq: Once | SUBCUTANEOUS | Status: AC
  Administered 2021-01-06: 400 mg via SUBCUTANEOUS

## 2021-01-06 NOTE — Patient Instructions (Signed)
Standing Labs °We placed an order today for your standing lab work.  ° °Please have your standing labs drawn in March 2022.  ° °If possible, please have your labs drawn 2 weeks prior to your appointment so that the provider can discuss your results at your appointment. ° °We have open lab daily °Monday through Thursday from 8:30-12:30 PM and 1:30-4:30 PM and Friday from 8:30-12:30 PM and 1:30-4:00 PM °at the office of Dr. Shaili Deveshwar, [facility].   °Please be advised, patients with office appointments requiring lab work will take precedents over walk-in lab work.  °If possible, please come for your lab work on Monday and Friday afternoons, as you may experience shorter wait times. °The office is located at [address], [address], [place],  [address] °No appointment is necessary.   °Labs are drawn by [facility]. Please bring your co-pay at the time of your lab draw.  You may receive a bill from [facility] for your lab work. ° °If you wish to have your labs drawn at another location, please call the office 24 hours in advance to send orders. ° °If you have any questions regarding directions or hours of operation,  °please call [phone].   °As a reminder, please drink plenty of water prior to coming for your lab work. Thanks! ° °

## 2021-01-06 NOTE — Progress Notes (Signed)
Pharmacy Note  Subjective:   Patient presents to clinic today to receive monthly dose of Cimzia.  Patient running a fever or have signs/symptoms of infection? No  Patient currently on antibiotics for the treatment of infection? No  Patient have any upcoming invasive procedures/surgeries? No  Objective: CMP     Component Value Date/Time   NA 141 12/09/2020 1157   K 4.6 12/09/2020 1157   CL 104 12/09/2020 1157   CO2 28 12/09/2020 1157   GLUCOSE 86 12/09/2020 1157   BUN 17 12/09/2020 1157   CREATININE 0.81 12/09/2020 1157   CALCIUM 8.9 12/09/2020 1157   PROT 5.9 (L) 12/09/2020 1157   ALBUMIN 4.1 01/30/2020 1149   AST 17 12/09/2020 1157   AST 18 01/30/2020 1149   ALT 15 12/09/2020 1157   ALT 19 01/30/2020 1149   ALKPHOS 52 01/30/2020 1149   BILITOT 0.3 12/09/2020 1157   BILITOT 0.2 (L) 01/30/2020 1149   GFRNONAA 94 12/09/2020 1157   GFRAA 109 12/09/2020 1157    CBC    Component Value Date/Time   WBC 6.6 12/09/2020 1157   RBC 4.18 12/09/2020 1157   HGB 13.3 12/09/2020 1157   HGB 12.3 06/10/2018 1446   HCT 39.8 12/09/2020 1157   PLT 268 12/09/2020 1157   PLT 229 06/10/2018 1446   MCV 95.2 12/09/2020 1157   MCH 31.8 12/09/2020 1157   MCHC 33.4 12/09/2020 1157   RDW 12.8 12/09/2020 1157   LYMPHSABS 1,650 12/09/2020 1157   MONOABS 0.4 01/30/2020 1149   EOSABS 845 (H) 12/09/2020 1157   BASOSABS 40 12/09/2020 1157    Baseline Immunosuppressant Therapy Labs TB GOLD Quantiferon TB Gold Latest Ref Rng & Units 05/07/2020  Quantiferon TB Gold Plus NEGATIVE NEGATIVE   Hepatitis Panel   HIV Lab Results  Component Value Date   HIV Non Reactive 07/07/2019   Immunoglobulins   SPEP Serum Protein Electrophoresis Latest Ref Rng & Units 12/09/2020  Total Protein 6.1 - 8.1 g/dL 5.9(L)   G6PD No results found for: G6PDH TPMT No results found for: TPMT   Chest x-ray: no active cardiopulmonary disease 06/29/2016  Assessment/Plan:   Administrations This Visit     Certolizumab Pegol KIT 400 mg    Admin Date 01/06/2021 Action Given Dose 400 mg Route Subcutaneous Administered By Carole Binning, LPN          Patient tolerated injection well.   Appointment for next injection scheduled for 02/07/2021.  Patient due for labs in March 2022.  Patient is to call and reschedule appointment if running a fever with signs/symptoms of infection, on antibiotics for active infection or has an upcoming invasive procedure.  All questions encouraged and answered.  Instructed patient to call with any further questions or concerns.

## 2021-01-23 ENCOUNTER — Other Ambulatory Visit: Payer: Self-pay | Admitting: Physician Assistant

## 2021-01-24 NOTE — Telephone Encounter (Signed)
Last Visit: 12/09/2020 Next Visit: 03/10/2021 Labs: 12/09/2020, CBC is normal. CMP shows low protein due to chronic disease. No change in treatment required.  Current Dose per office note 12/09/2020, methotrexate 2.5 mg 8 tablets every 7 days DX: Rheumatoid arthritis with rheumatoid factor of multiple sites without organ or systems involvement   Okay to refill MTX?

## 2021-02-02 MED FILL — CIMZIA 200 MG/ML SYRN KIT: 2 X 200 | 28 days supply | Qty: 1 | Fill #1

## 2021-02-07 ENCOUNTER — Other Ambulatory Visit: Payer: Self-pay

## 2021-02-07 ENCOUNTER — Ambulatory Visit (INDEPENDENT_AMBULATORY_CARE_PROVIDER_SITE_OTHER): Payer: Medicaid Other | Admitting: *Deleted

## 2021-02-07 VITALS — BP 120/83 | HR 101

## 2021-02-07 DIAGNOSIS — M0579 Rheumatoid arthritis with rheumatoid factor of multiple sites without organ or systems involvement: Secondary | ICD-10-CM | POA: Diagnosis not present

## 2021-02-07 MED ORDER — CERTOLIZUMAB PEGOL 2 X 200 MG/ML ~~LOC~~ KIT
400.0000 mg | PACK | Freq: Once | SUBCUTANEOUS | Status: AC
  Administered 2021-02-07: 400 mg via SUBCUTANEOUS

## 2021-02-07 NOTE — Progress Notes (Signed)
Pharmacy Note  Subjective:   Patient presents to clinic today to receive monthly dose of Cimzia.  Patient running a fever or have signs/symptoms of infection? No  Patient currently on antibiotics for the treatment of infection? No  Patient have any upcoming invasive procedures/surgeries? No  Objective: CMP     Component Value Date/Time   NA 141 12/09/2020 1157   K 4.6 12/09/2020 1157   CL 104 12/09/2020 1157   CO2 28 12/09/2020 1157   GLUCOSE 86 12/09/2020 1157   BUN 17 12/09/2020 1157   CREATININE 0.81 12/09/2020 1157   CALCIUM 8.9 12/09/2020 1157   PROT 5.9 (L) 12/09/2020 1157   ALBUMIN 4.1 01/30/2020 1149   AST 17 12/09/2020 1157   AST 18 01/30/2020 1149   ALT 15 12/09/2020 1157   ALT 19 01/30/2020 1149   ALKPHOS 52 01/30/2020 1149   BILITOT 0.3 12/09/2020 1157   BILITOT 0.2 (L) 01/30/2020 1149   GFRNONAA 94 12/09/2020 1157   GFRAA 109 12/09/2020 1157    CBC    Component Value Date/Time   WBC 6.6 12/09/2020 1157   RBC 4.18 12/09/2020 1157   HGB 13.3 12/09/2020 1157   HGB 12.3 06/10/2018 1446   HCT 39.8 12/09/2020 1157   PLT 268 12/09/2020 1157   PLT 229 06/10/2018 1446   MCV 95.2 12/09/2020 1157   MCH 31.8 12/09/2020 1157   MCHC 33.4 12/09/2020 1157   RDW 12.8 12/09/2020 1157   LYMPHSABS 1,650 12/09/2020 1157   MONOABS 0.4 01/30/2020 1149   EOSABS 845 (H) 12/09/2020 1157   BASOSABS 40 12/09/2020 1157    Baseline Immunosuppressant Therapy Labs TB GOLD Quantiferon TB Gold Latest Ref Rng & Units 05/07/2020  Quantiferon TB Gold Plus NEGATIVE NEGATIVE   Hepatitis Panel   HIV Lab Results  Component Value Date   HIV Non Reactive 07/07/2019   Immunoglobulins   SPEP Serum Protein Electrophoresis Latest Ref Rng & Units 12/09/2020  Total Protein 6.1 - 8.1 g/dL 5.9(L)   G6PD No results found for: G6PDH TPMT No results found for: TPMT   Chest x-ray: no active cardiopulmonary disease 06/29/2016  Assessment/Plan:   Administrations This Visit     Certolizumab Pegol KIT 400 mg    Admin Date 02/07/2021 Action Given Dose 400 mg Route Subcutaneous Administered By Carole Binning, LPN          Patient tolerated injection well.   Appointment for next injection scheduled for March 07, 2021.  Patient due for labs in March 2022.  Patient is to call and reschedule appointment if running a fever with signs/symptoms of infection, on antibiotics for active infection or has an upcoming invasive procedure.  All questions encouraged and answered.  Instructed patient to call with any further questions or concerns.

## 2021-03-07 ENCOUNTER — Ambulatory Visit (INDEPENDENT_AMBULATORY_CARE_PROVIDER_SITE_OTHER): Payer: Medicaid Other

## 2021-03-07 ENCOUNTER — Other Ambulatory Visit: Payer: Self-pay

## 2021-03-07 DIAGNOSIS — Z79899 Other long term (current) drug therapy: Secondary | ICD-10-CM

## 2021-03-07 DIAGNOSIS — M0579 Rheumatoid arthritis with rheumatoid factor of multiple sites without organ or systems involvement: Secondary | ICD-10-CM | POA: Diagnosis not present

## 2021-03-07 MED ORDER — CERTOLIZUMAB PEGOL 2 X 200 MG/ML ~~LOC~~ KIT
400.0000 mg | PACK | Freq: Once | SUBCUTANEOUS | Status: AC
Start: 2021-03-07 — End: 2021-03-07
  Administered 2021-03-07: 400 mg via SUBCUTANEOUS

## 2021-03-07 NOTE — Progress Notes (Signed)
Pharmacy Note  Subjective:   Patient presents to clinic today to receive monthly dose of Cimzia.  Patient running a fever or have signs/symptoms of infection? No  Patient currently on antibiotics for the treatment of infection? No  Patient have any upcoming invasive procedures/surgeries? No  Objective: CMP     Component Value Date/Time   NA 141 12/09/2020 1157   K 4.6 12/09/2020 1157   CL 104 12/09/2020 1157   CO2 28 12/09/2020 1157   GLUCOSE 86 12/09/2020 1157   BUN 17 12/09/2020 1157   CREATININE 0.81 12/09/2020 1157   CALCIUM 8.9 12/09/2020 1157   PROT 5.9 (L) 12/09/2020 1157   ALBUMIN 4.1 01/30/2020 1149   AST 17 12/09/2020 1157   AST 18 01/30/2020 1149   ALT 15 12/09/2020 1157   ALT 19 01/30/2020 1149   ALKPHOS 52 01/30/2020 1149   BILITOT 0.3 12/09/2020 1157   BILITOT 0.2 (L) 01/30/2020 1149   GFRNONAA 94 12/09/2020 1157   GFRAA 109 12/09/2020 1157    CBC    Component Value Date/Time   WBC 6.6 12/09/2020 1157   RBC 4.18 12/09/2020 1157   HGB 13.3 12/09/2020 1157   HGB 12.3 06/10/2018 1446   HCT 39.8 12/09/2020 1157   PLT 268 12/09/2020 1157   PLT 229 06/10/2018 1446   MCV 95.2 12/09/2020 1157   MCH 31.8 12/09/2020 1157   MCHC 33.4 12/09/2020 1157   RDW 12.8 12/09/2020 1157   LYMPHSABS 1,650 12/09/2020 1157   MONOABS 0.4 01/30/2020 1149   EOSABS 845 (H) 12/09/2020 1157   BASOSABS 40 12/09/2020 1157    Baseline Immunosuppressant Therapy Labs TB GOLD Quantiferon TB Gold Latest Ref Rng & Units 05/07/2020  Quantiferon TB Gold Plus NEGATIVE NEGATIVE   Hepatitis Panel   HIV Lab Results  Component Value Date   HIV Non Reactive 07/07/2019   Immunoglobulins   SPEP Serum Protein Electrophoresis Latest Ref Rng & Units 12/09/2020  Total Protein 6.1 - 8.1 g/dL 5.9(L)   G6PD No results found for: G6PDH TPMT No results found for: TPMT   Chest x-ray: 12/31/2015  Assessment/Plan:  Administrations This Visit    Certolizumab Pegol KIT 400 mg     Admin Date 03/07/2021 Action Given Dose 400 mg Route Subcutaneous Administered By Earnestine Mealing, CMA         Patient tolerated well.    Appointment for next injection is scheduled for 04/04/2021. Patient updated labs today in the office. Patient is to call and reschedule appointment if she is running a fever with sign/symptoms of infection, on antibiotics for active infection or has an upcoming invasive procedure.  All questions encouraged and answered.  Instructed patient to call with any further questions or concerns.

## 2021-03-08 LAB — COMPLETE METABOLIC PANEL WITH GFR
AG Ratio: 1.6 (calc) (ref 1.0–2.5)
ALT: 13 U/L (ref 6–29)
AST: 17 U/L (ref 10–30)
Albumin: 3.8 g/dL (ref 3.6–5.1)
Alkaline phosphatase (APISO): 49 U/L (ref 31–125)
BUN: 21 mg/dL (ref 7–25)
CO2: 24 mmol/L (ref 20–32)
Calcium: 8.5 mg/dL — ABNORMAL LOW (ref 8.6–10.2)
Chloride: 107 mmol/L (ref 98–110)
Creat: 0.74 mg/dL (ref 0.50–1.10)
GFR, Est African American: 122 mL/min/{1.73_m2} (ref >=60)
GFR, Est Non African American: 105 mL/min/{1.73_m2} (ref >=60)
Globulin: 2.4 g/dL (calc) (ref 1.9–3.7)
Glucose, Bld: 97 mg/dL (ref 65–99)
Potassium: 4.5 mmol/L (ref 3.5–5.3)
Sodium: 141 mmol/L (ref 135–146)
Total Bilirubin: 0.3 mg/dL (ref 0.2–1.2)
Total Protein: 6.2 g/dL (ref 6.1–8.1)

## 2021-03-08 LAB — CBC WITH DIFFERENTIAL/PLATELET
Absolute Monocytes: 562 cells/uL (ref 200–950)
Basophils Absolute: 37 cells/uL (ref 0–200)
Basophils Relative: 0.5 %
Eosinophils Absolute: 861 cells/uL — ABNORMAL HIGH (ref 15–500)
Eosinophils Relative: 11.8 %
HCT: 37.9 % (ref 35.0–45.0)
Hemoglobin: 12.7 g/dL (ref 11.7–15.5)
Lymphs Abs: 2358 cells/uL (ref 850–3900)
MCH: 31.4 pg (ref 27.0–33.0)
MCHC: 33.5 g/dL (ref 32.0–36.0)
MCV: 93.6 fL (ref 80.0–100.0)
MPV: 11.9 fL (ref 7.5–12.5)
Monocytes Relative: 7.7 %
Neutro Abs: 3482 cells/uL (ref 1500–7800)
Neutrophils Relative %: 47.7 %
Platelets: 281 10*3/uL (ref 140–400)
RBC: 4.05 10*6/uL (ref 3.80–5.10)
RDW: 13.3 % (ref 11.0–15.0)
Total Lymphocyte: 32.3 %
WBC: 7.3 10*3/uL (ref 3.8–10.8)

## 2021-03-08 NOTE — Progress Notes (Signed)
Calcium is borderline low-8.5.  Please advise the patient to increase dietary intake of calcium. Rest of CMP WNL. Absolute eosinophils borderline elevated-861.  Rest of CBC WNL.

## 2021-03-10 ENCOUNTER — Ambulatory Visit (INDEPENDENT_AMBULATORY_CARE_PROVIDER_SITE_OTHER): Payer: Medicaid Other | Admitting: Physician Assistant

## 2021-03-10 ENCOUNTER — Encounter: Payer: Self-pay | Admitting: Physician Assistant

## 2021-03-10 ENCOUNTER — Other Ambulatory Visit: Payer: Self-pay

## 2021-03-10 VITALS — BP 116/75 | HR 105 | Resp 14 | Ht 65.0 in | Wt 130.0 lb

## 2021-03-10 DIAGNOSIS — Z79899 Other long term (current) drug therapy: Secondary | ICD-10-CM

## 2021-03-10 DIAGNOSIS — R2241 Localized swelling, mass and lump, right lower limb: Secondary | ICD-10-CM | POA: Diagnosis not present

## 2021-03-10 DIAGNOSIS — Z862 Personal history of diseases of the blood and blood-forming organs and certain disorders involving the immune mechanism: Secondary | ICD-10-CM

## 2021-03-10 DIAGNOSIS — M0579 Rheumatoid arthritis with rheumatoid factor of multiple sites without organ or systems involvement: Secondary | ICD-10-CM | POA: Diagnosis not present

## 2021-03-10 DIAGNOSIS — E559 Vitamin D deficiency, unspecified: Secondary | ICD-10-CM

## 2021-03-10 DIAGNOSIS — Z111 Encounter for screening for respiratory tuberculosis: Secondary | ICD-10-CM

## 2021-03-10 MED ORDER — FOLIC ACID 1 MG PO TABS: 2.0000 mg | ORAL_TABLET | Freq: Every day | ORAL | 3 refills | Status: AC

## 2021-03-10 NOTE — Progress Notes (Signed)
Office Visit Note  Patient: Hayley Burke             Date of Birth: [birth year]           MRN: [id number]             PCP: Cora Collum, DO Referring: Cora Collum, DO Visit Date: 03/10/2021 Occupation: @GUAROCC @  Subjective:  Medication monitoring   History of Present Illness: Hayley Burke is a 35 y.o. female with history of seropositive rheumatoid arthritis patient is currently on Cimzia 400 mg subcutaneous injections every 28 days, methotrexate 8 tablets by mouth once weekly, and folic acid 2 mg by mouth daily.  Her most recent Cimzia injection was in the office on 03/07/2021.  According to the patient the night of her Cimzia injection she was experiencing pain on the lateral aspect of the right hip, right shoulder, and left knee joint which lasted for about 24 hours.  She took ibuprofen and her symptoms improved.  She is also restarted taking turmeric on a daily basis.  She denies any joint pain or joint swelling at this time.  She has been experiencing less frequent and less severe flares on the current treatment regimen.  Her flares are typically exacerbated by strenuous activities or increased stress. She denies any recent infections.    Activities of Daily Living:  Patient reports morning stiffness for 1 hour.   Patient Denies nocturnal pain.  Difficulty dressing/grooming: Denies Difficulty climbing stairs: Denies Difficulty getting out of chair: Denies Difficulty using hands for taps, buttons, cutlery, and/or writing: Reports  Review of Systems  Constitutional: Negative for fatigue.  HENT: Positive for mouth dryness. Negative for mouth sores and nose dryness.   Eyes: Positive for dryness. Negative for pain and visual disturbance.  Respiratory: Negative for cough, hemoptysis, shortness of breath and difficulty breathing.   Cardiovascular: Negative for chest pain, palpitations, hypertension and swelling in legs/feet.  Gastrointestinal: Negative for blood in stool,  constipation and diarrhea.  Endocrine: Negative for excessive thirst and increased urination.  Genitourinary: Negative for difficulty urinating and painful urination.  Musculoskeletal: Positive for morning stiffness. Negative for arthralgias, joint pain, joint swelling, myalgias, muscle weakness, muscle tenderness and myalgias.  Skin: Negative for color change, pallor, rash, hair loss, nodules/bumps, skin tightness, ulcers and sensitivity to sunlight.  Allergic/Immunologic: Negative for susceptible to infections.  Neurological: Negative for dizziness, numbness, headaches and weakness.  Hematological: Positive for bruising/bleeding tendency. Negative for swollen glands.  Psychiatric/Behavioral: Negative for depressed mood and sleep disturbance. The patient is not nervous/anxious.     PMFS History:  Patient Active Problem List   Diagnosis Date Noted  . Acute otitis externa of left ear 06/09/2020  . Acute otitis media 06/09/2020  . Viral pharyngitis 06/09/2020  . 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 IUD (intrauterine device) in place 07/07/2019  . Pernicious anemia 03/20/2018  . 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 High risk medications (not anticoagulants) long-term use 05/03/2017  . Family history of breast cancer 01/19/2016    Past Medical History:  Diagnosis Date  . Rheumatoid arthritis (HCC)     Family History  Problem Relation Age of Onset  . Sjogren's syndrome Mother   . Breast cancer Mother   . Depression Mother   . Lung cancer Father   . Hyperlipidemia Father   . Drug abuse Brother   . Heart attack Paternal Grandfather    Past Surgical History:  Procedure Laterality Date  . CESAREAN SECTION    . TONSILLECTOMY AND ADENOIDECTOMY     Social History   Social History Narrative  . Not on file   Immunization History  Administered Date(s) Administered  . Influenza,inj,Quad PF,6+ Mos  11/07/2018  . PFIZER(Purple Top)SARS-COV-2 Vaccination 05/11/2020, 06/01/2020  . Pneumococcal Polysaccharide-23 01/06/2016  . Tdap 05/12/2018     Objective: Vital Signs: BP 116/75 (BP Location: Left Arm, Patient Position: Sitting, Cuff Size: Normal)   Pulse (!) 105   Resp 14   Ht 5\' 5"  (1.651 m)   Wt 130 lb (59 kg)   BMI 21.63 kg/m    Physical Exam Vitals and nursing note reviewed.  Constitutional:      Appearance: She is well-developed.  HENT:     Head: Normocephalic and atraumatic.  Eyes:     Conjunctiva/sclera: Conjunctivae normal.  Pulmonary:     Effort: Pulmonary effort is normal.  Abdominal:     Palpations: Abdomen is soft.  Musculoskeletal:     Cervical back: Normal range of motion.  Skin:    General: Skin is warm and dry.     Capillary Refill: Capillary refill takes less than 2 seconds.  Neurological:     Mental Status: She is alert and oriented to person, place, and time.  Psychiatric:        Behavior: Behavior normal.      Musculoskeletal Exam: C-spine, thoracic spine, and lumbar spine have good range of motion with no discomfort.  Shoulder joints, elbow joints, wrist joints, MCPs, PIPs, DIPs have good range of motion with no synovitis.  She is able to make a complete fist bilaterally.  Hip joints, knee joints, ankle joints, MTPs, PIPs, DIPs have good range of motion with no synovitis.  No warmth or effusion of knee joints noted.  No tenderness or swelling of ankle joints.  No tenderness along the Achilles tendon or plantar fascia.  No tenderness of MTP joints.  Subcutaneous nodule on plantar aspect of right great toe.   CDAI Exam: CDAI Score: 0.2  Patient Global: 1 mm; Provider Global: 1 mm Swollen: 0 ; Tender: 0  Joint Exam 03/10/2021   No joint exam has been documented for this visit   There is currently no information documented on the homunculus. Go to the Rheumatology activity and complete the homunculus joint exam.  Investigation: No additional  findings.  Imaging: No results found.  Recent Labs: Lab Results  Component Value Date   WBC 7.3 03/07/2021   HGB 12.7 03/07/2021   PLT 281 03/07/2021   NA 141 03/07/2021   K 4.5 03/07/2021   CL 107 03/07/2021   CO2 24 03/07/2021   GLUCOSE 97 03/07/2021   BUN 21 03/07/2021   CREATININE 0.74 03/07/2021   BILITOT 0.3 03/07/2021   ALKPHOS 52 01/30/2020   AST 17 03/07/2021   ALT 13 03/07/2021   PROT 6.2 03/07/2021   ALBUMIN 4.1 01/30/2020   CALCIUM 8.5 (L) 03/07/2021   GFRAA 122 03/07/2021   QFTBGOLDPLUS NEGATIVE 05/07/2020    Speciality Comments: No specialty comments available.  Procedures:  No procedures performed Allergies: Sulfa antibiotics   Assessment / Plan:     Visit Diagnoses: Rheumatoid arthritis with rheumatoid factor of multiple sites without organ or systems involvement [facility]): She has no joint tenderness or synovitis on exam.  She has clinically been doing well on Cimzia 400 mg subcutaneous injections every 28 days, methotrexate 8 tablets by mouth once weekly, and folic acid 2 mg by  mouth daily.  Her most recent Cimzia injection was in the office on 03/07/2021.  She has not missed any doses of methotrexate recently.  The evening of her Cimzia injection she was experiencing increased pain on the lateral aspect of the right hip, right shoulder, and left knee joint which lasted for about 24 hours.  She took ibuprofen which resolved her symptoms.  She has had less frequent and severe flares on the current treatment regimen.  No medication changes will be made at this time.  She was advised to notify us if she develops more frequent flares.  She will follow-up in the office in 5 months.  High risk medication use -Cimzia 400 mg subcutaneous injections every 28 days, methotrexate 8 tablets by mouth once weekly, and folic acid 2 mg by mouth daily.  CBC and CMP were drawn on 03/07/2021 and results were reviewed with the patient today in the office.  TB gold negative on 05/07/2020.   Future order for TB gold was placed today.  Plan: QuantiFERON-TB Gold Plus, CBC with Differential/Platelet, COMPLETE METABOLIC PANEL WITH GFR She has not had any recent infection.  She was advised to hold Cimzia and methotrexate if she develops signs or symptoms of an infection and to be cleared prior to resuming both medications once the infection has cleared. She has had 2 Pfizer COVID-19 vaccine doses.  She was encouraged to receive the booster dose.  She was advised to hold methotrexate 1 week after the booster.  Subcutaneous nodule of right foot: Unchanged.  Nontender.  Vitamin D deficiency: She is taking a calcium and vitamin D supplement on a daily basis.  History of anemia: Hemoglobin was 12.7 on 03/07/2021.  Screening for tuberculosis - TB gold negative on 05/07/2020.  Future order for TB gold was placed today.  Plan: QuantiFERON-TB Gold Plus  Orders: Orders Placed This Encounter  Procedures  . QuantiFERON-TB Gold Plus  . CBC with Differential/Platelet  . COMPLETE METABOLIC PANEL WITH GFR   Meds ordered this encounter  Medications  . folic acid (FOLVITE) 1 MG tablet    Sig: Take 2 tablets (2 mg total) by mouth daily.    Dispense:  180 tablet    Refill:  3    Follow-Up Instructions: Return in about 5 months (around 08/10/2021) for Rheumatoid arthritis.   Gearldine Bienenstock, PA-C  Note - This record has been created using Dragon software.  Chart creation errors have been sought, but may not always  have been located. Such creation errors do not reflect on  the standard of medical care.

## 2021-03-10 NOTE — Patient Instructions (Signed)
Standing Labs We placed an order today for your standing lab work.   Please have your standing labs drawn in June and every 3 months   If possible, please have your labs drawn 2 weeks prior to your appointment so that the provider can discuss your results at your appointment.  We have open lab daily Monday through Thursday from 1:30-4:30 PM and Friday from 1:30-4:00 PM at the office of Dr. Shaili Deveshwar, [facility].   Please be advised, all patients with office appointments requiring lab work will take precedents over walk-in lab work.  If possible, please come for your lab work on Monday and Friday afternoons, as you may experience shorter wait times. The office is located at [address], [address], , [place] [address] No appointment is necessary.   Labs are drawn by [facility]. Please bring your co-pay at the time of your lab draw.  You may receive a bill from [facility] for your lab work.  If you wish to have your labs drawn at another location, please call the office 24 hours in advance to send orders.  If you have any questions regarding directions or hours of operation,  please call [phone].   As a reminder, please drink plenty of water prior to coming for your lab work. Thanks!   

## 2021-03-15 ENCOUNTER — Other Ambulatory Visit (HOSPITAL_COMMUNITY): Payer: Self-pay

## 2021-03-25 ENCOUNTER — Telehealth: Payer: Self-pay | Admitting: Rheumatology

## 2021-03-25 MED ORDER — PREDNISONE 5 MG PO TABS
ORAL_TABLET | ORAL | 0 refills | Status: DC
Start: 2021-03-25 — End: 2021-03-31

## 2021-03-25 NOTE — Telephone Encounter (Signed)
Ok to send in a prednisone taper starting at 20 mg tapering by 5 mg every 4 days.

## 2021-03-25 NOTE — Telephone Encounter (Signed)
Verbal order provided by Sherron Ales, PA-C to send in prednisone taper. Patient has been advised prednisone taper has been sent.

## 2021-03-25 NOTE — Telephone Encounter (Signed)
Patient calling to see if doctor will call in a taper dose of Prednisone. Patient had to stop medication due to taking an antibiotic for a tooth that she is going to have a root canal on. Patient is having a flare, and did take one dose of MTX yesterday. Patient will have to go on another antibiotic for tooth tomorrow because dentist wants her to take a stronger antibiotic. [facility] uses [facility] [address] in [place]. Please call patient to advise. She is in clinical until 5 pm, and will call back if she does not answer.

## 2021-03-28 ENCOUNTER — Other Ambulatory Visit (HOSPITAL_COMMUNITY): Payer: Self-pay

## 2021-03-30 ENCOUNTER — Other Ambulatory Visit (HOSPITAL_COMMUNITY): Payer: Self-pay

## 2021-03-31 ENCOUNTER — Other Ambulatory Visit: Payer: Self-pay

## 2021-03-31 ENCOUNTER — Other Ambulatory Visit (HOSPITAL_COMMUNITY): Payer: Self-pay

## 2021-03-31 ENCOUNTER — Other Ambulatory Visit: Payer: Self-pay | Admitting: Rheumatology

## 2021-03-31 DIAGNOSIS — M0579 Rheumatoid arthritis with rheumatoid factor of multiple sites without organ or systems involvement: Secondary | ICD-10-CM

## 2021-03-31 MED ORDER — PREDNISONE 5 MG PO TABS
ORAL_TABLET | ORAL | 0 refills | Status: DC
Start: 2021-03-31 — End: 2021-07-13

## 2021-03-31 MED ORDER — CIMZIA PREFILLED 2 X 200 MG/ML ~~LOC~~ KIT
PACK | SUBCUTANEOUS | 2 refills | Status: AC
  Filled 2021-03-31: qty 1, 28d supply, fill #0

## 2021-03-31 NOTE — Telephone Encounter (Signed)
Spoke with patient and she was taking amoxicillin when she called in on 03/25/2021 due to a tooth infection. She then followed up at the dentist and was prescribed clindamycin. She will be finishing the antibiotics on 04/03/2021 and the infection has completely resolved per patient. She is scheduled for a root canal on 04/22/2021.   Patient is due for cimzia injection on 04/04/2021. Please advise if she can inject or how long it should be postponed. Thanks!

## 2021-03-31 NOTE — Telephone Encounter (Signed)
Advised patient that prednisone taper has been sent to the pharmacy. Patient will be holding cimzia and methotrexate. Patient verbalized understanding. Patient will call the office after the root canal to provide update.

## 2021-03-31 NOTE — Telephone Encounter (Signed)
I advised patient to postpone cimzia until after she has been cleared by her dentist. The patient states she took her methotrexate on 03/24/2021 which was "in between" antibiotics. The patient is worried about flaring since she will now be holding cimzia and methotrexate. She does not see the dentist again until 04/22/2021 for the scheduled root canal. I advised the patient to call and request a sooner appointment and the patient states she scheduled it for 04/22/2021 since she will be finished with school. Patient has a few days remaining of the prednisone taper that was prescribed on 03/25/2021. Please make recommendations for the time frame until she is cleared by the dentist.   I have canceled the nurse visit on 04/04/2021 for the Cimzia injection.

## 2021-03-31 NOTE — Telephone Encounter (Signed)
Please advise the patient to postpone cimzia until after she has been cleared by her dentist.

## 2021-03-31 NOTE — Telephone Encounter (Signed)
Next Visit: 08/11/2021  Last Visit: 03/10/2021  Last Fill: 01/03/2021  KM:[id number] arthritis with rheumatoid factor of multiple sites without organ or systems involvement   Current Dose per office note 03/10/2021, Cimzia 400 mg subcutaneous injections every 28 days  Labs: 03/07/2021, Calcium is borderline low-8.5. Please advise the patient to increase dietary intake of calcium. Rest of CMP WNL. Absolute eosinophils borderline elevated-861. Rest of CBC WNL.   TB Gold: 05/07/2020, negative  Okay to refill Cimzia?

## 2021-03-31 NOTE — Telephone Encounter (Signed)
Ok to extend prednisone taper.  She can take prednisone staring at 20 mg tapering by 5 mg every week.

## 2021-04-04 ENCOUNTER — Ambulatory Visit: Payer: Medicaid Other

## 2021-04-08 ENCOUNTER — Other Ambulatory Visit: Payer: Self-pay | Admitting: Physician Assistant

## 2021-04-08 NOTE — Telephone Encounter (Signed)
Next Visit: 08/11/2021  Last Visit: 03/10/2021  Last Fill: 01/24/2021  DX:  Rheumatoid arthritis with rheumatoid factor of multiple sites without organ or systems involvement   Current Dose per office note 3/24/202, methotrexate 8 tablets by mouth once weekly  Labs: 03/07/2021, Calcium is borderline low-8.5. Please advise the patient to increase dietary intake of calcium. Rest of CMP WNL. Absolute eosinophils borderline elevated-861. Rest of CBC WNL.   Okay to refill MTX?

## 2021-04-15 ENCOUNTER — Telehealth: Payer: Self-pay

## 2021-04-15 NOTE — Telephone Encounter (Signed)
I returned patient's call.  Patient believes that the infection in her tooth has resolved.  She will be getting root canal soon.  I advised her to contact her dentist and take their permission to restart immunosuppressive agents if they think that the risk of infection is low.  Otherwise she should stay off the immunosuppressive agents until the procedure is complete and restart after the procedure after she gets clearance from the dentist.  She voiced understanding.

## 2021-04-15 NOTE — Telephone Encounter (Signed)
Patient called stating her root canal is scheduled for next Friday, 04/22/21.  Patient states she just completed her 7 day course of antibiotics.  Patient states Ladona Ridgel wrote her a prescription of Prednisone since she had to stop taking her Cimzia and Methotrexate.  Patient states she is having pain in her bilateral shoulders and thumb and would like to restart her Cimzia or increase her Prednisone.  Patient states she realizes it is a high risk to take Cimzia with antibiotics, but is willing to take the risk if Ladona Ridgel agrees. Patient requested a return call before 1:00 pm today if possible.  Patient states she will be in clinicals at [facility] this afternoon.

## 2021-04-22 ENCOUNTER — Other Ambulatory Visit: Payer: Self-pay | Admitting: Physician Assistant

## 2021-04-25 ENCOUNTER — Telehealth: Payer: Self-pay | Admitting: Rheumatology

## 2021-04-25 ENCOUNTER — Other Ambulatory Visit (HOSPITAL_COMMUNITY): Payer: Self-pay

## 2021-04-25 ENCOUNTER — Other Ambulatory Visit: Payer: Self-pay

## 2021-04-25 ENCOUNTER — Ambulatory Visit (INDEPENDENT_AMBULATORY_CARE_PROVIDER_SITE_OTHER): Payer: Medicaid Other

## 2021-04-25 VITALS — BP 142/92 | HR 96

## 2021-04-25 DIAGNOSIS — M0579 Rheumatoid arthritis with rheumatoid factor of multiple sites without organ or systems involvement: Secondary | ICD-10-CM

## 2021-04-25 MED ORDER — CERTOLIZUMAB PEGOL 2 X 200 MG ~~LOC~~ KIT
400.0000 mg | PACK | Freq: Once | SUBCUTANEOUS | Status: AC
  Administered 2021-04-25: 400 mg via SUBCUTANEOUS

## 2021-04-25 NOTE — Telephone Encounter (Signed)
Patient states the tooth infection has resolved and her root canal is now scheduled for June. Patient was advised by her dentist she can resume Cimzia injections. Patient is now scheduled for today, 04/25/2021 at 1:00pm to receive Cimzia injections.

## 2021-04-25 NOTE — Progress Notes (Signed)
Pharmacy Note  Subjective:   Patient presents to clinic today to receive monthly dose of Cimzia.  Patient running a fever or have signs/symptoms of infection? No  Patient currently on antibiotics for the treatment of infection? No  Patient have any upcoming invasive procedures/surgeries? No  Objective: CMP     Component Value Date/Time   NA 141 03/07/2021 1440   K 4.5 03/07/2021 1440   CL 107 03/07/2021 1440   CO2 24 03/07/2021 1440   GLUCOSE 97 03/07/2021 1440   BUN 21 03/07/2021 1440   CREATININE 0.74 03/07/2021 1440   CALCIUM 8.5 (L) 03/07/2021 1440   PROT 6.2 03/07/2021 1440   ALBUMIN 4.1 01/30/2020 1149   AST 17 03/07/2021 1440   AST 18 01/30/2020 1149   ALT 13 03/07/2021 1440   ALT 19 01/30/2020 1149   ALKPHOS 52 01/30/2020 1149   BILITOT 0.3 03/07/2021 1440   BILITOT 0.2 (L) 01/30/2020 1149   GFRNONAA 105 03/07/2021 1440   GFRAA 122 03/07/2021 1440    CBC    Component Value Date/Time   WBC 7.3 03/07/2021 1440   RBC 4.05 03/07/2021 1440   HGB 12.7 03/07/2021 1440   HGB 12.3 06/10/2018 1446   HCT 37.9 03/07/2021 1440   PLT 281 03/07/2021 1440   PLT 229 06/10/2018 1446   MCV 93.6 03/07/2021 1440   MCH 31.4 03/07/2021 1440   MCHC 33.5 03/07/2021 1440   RDW 13.3 03/07/2021 1440   LYMPHSABS 2,358 03/07/2021 1440   MONOABS 0.4 01/30/2020 1149   EOSABS 861 (H) 03/07/2021 1440   BASOSABS 37 03/07/2021 1440    Baseline Immunosuppressant Therapy Labs TB GOLD Quantiferon TB Gold Latest Ref Rng & Units 05/07/2020  Quantiferon TB Gold Plus NEGATIVE NEGATIVE   Hepatitis Panel   HIV Lab Results  Component Value Date   HIV Non Reactive 07/07/2019   Immunoglobulins   SPEP Serum Protein Electrophoresis Latest Ref Rng & Units 03/07/2021  Total Protein 6.1 - 8.1 g/dL 6.2   G6PD No results found for: G6PDH TPMT No results found for: TPMT   Chest x-ray: 12/31/2015 No active cardiopulmonary disease.  Assessment/Plan:    Administrations This Visit     Certolizumab Pegol KIT 400 mg    Admin Date 04/25/2021 Action Given Dose 400 mg Route Subcutaneous Administered By Earnestine Mealing, CMA         Patient tolerated well.   Appointment for next injection is scheduled for 05/23/2021. Labs were updated on 03/07/2021. Tb gold is due to be updated on 05/07/2021. Patient is aware. Patient is to call and reschedule appointment if she is running a fever with sign/symptoms of infection, on antibiotics for active infection or has an upcoming invasive procedure.  All questions encouraged and answered.  Instructed patient to call with any further questions or concerns.

## 2021-04-25 NOTE — Telephone Encounter (Signed)
Attempted to contact patient and left message on machine for patient to call the office. Need to ensure patient has received clearance from dentist to go back on Cimzia.

## 2021-04-25 NOTE — Telephone Encounter (Signed)
Please call patient to schedule Cimzia appointment.

## 2021-04-27 ENCOUNTER — Ambulatory Visit (INDEPENDENT_AMBULATORY_CARE_PROVIDER_SITE_OTHER): Payer: Medicaid Other | Admitting: Family Medicine

## 2021-04-27 ENCOUNTER — Other Ambulatory Visit: Payer: Self-pay

## 2021-04-27 VITALS — BP 118/74 | HR 87 | Wt 134.0 lb

## 2021-04-27 DIAGNOSIS — G479 Sleep disorder, unspecified: Secondary | ICD-10-CM

## 2021-04-27 DIAGNOSIS — R5383 Other fatigue: Secondary | ICD-10-CM

## 2021-04-27 MED ORDER — HYDROXYZINE HCL 25 MG PO TABS: 25.0000 mg | ORAL_TABLET | Freq: Three times a day (TID) | ORAL | 0 refills | Status: AC | PRN

## 2021-04-27 NOTE — Assessment & Plan Note (Signed)
Pt reports fatigue ongoing for the past 2 years. Etiology unclear. Hx of IDA, RA. Possibly caffeine induced fatigue, hypothyroidism, Vitamin deficiency or anemia. Doubt malignancy. No EKGs available for review.  Obtain CBC, CMP, TSH, Vit D, B12, ferritin and TIBC.

## 2021-04-27 NOTE — Progress Notes (Signed)
   SUBJECTIVE:   CHIEF COMPLAINT / HPI:   Chief Complaint  Patient presents with  . sleeps concerns     Hayley Burke is a 35 y.o. female here for difficulty sleeping and chronic fatigue.   Pt reports feeling tired and not being about to make it through her day. Drinks multiple C4's to make it through the day. 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 She doesn't want to be tired at work. Has hx of RA and spoke with her rheumatologist regarding her sx who referred to her PCP. Previously had iron infusions through the cancer center but has not had them in a while. No heavy menstrual periods and rarely has a period as she has a IUD. Denies chest discomfort, shortness of breath, fevers, unintended weight loss, night sweats.    PERTINENT  PMH / PSH: reviewed and updated as appropriate   OBJECTIVE:   BP 118/74   Pulse 87   Wt 134 lb (60.8 kg)   SpO2 99%   BMI 22.30 kg/m    GEN: pleasant well appearing female, in no acute distress  CV: regular rate and rhythm, no murmurs appreciated  RESP: no increased work of breathing, clear to ascultation bilaterally ABD: Bowel sounds present. Soft, Nontender, Nondistended.  MSK: normal ROM SKIN: warm, dry PSYCH: Normal affect, appropriate speech and behavior    ASSESSMENT/PLAN:   Difficulty sleeping Trial Atarax at bedtime. Discussed sleep hygiene including making sure bedroom is quiet, dark, relaxing, and at a comfortable temperature. Removing electronic devices from bedroom. Avoiding large meals, alcohol and caffeine before bedtime. Exercising as tolerated as this can help pt fall asleep more easily at night.  Other fatigue Pt reports fatigue ongoing for the past 2 years. Etiology unclear. Hx of IDA, RA. Possibly caffeine induced fatigue, hypothyroidism, Vitamin deficiency or anemia. Doubt malignancy. No EKGs available for review.  Obtain CBC, CMP, TSH, Vit D, B12, ferritin and TIBC.      Katha Cabal, DO PGY-2, [facility] 04/27/2021

## 2021-04-27 NOTE — Patient Instructions (Addendum)
It was great seeing you today!  Follow up with your PCP if your symptoms do not improve.   Visit Remembers: - Stop by the pharmacy to pick up your prescriptions  - Continue to work on your healthy eating habits and incorporating exercise into your daily life. - Your goal is to have an BP < 120/80 - Medicine Changes: Start Hydroxyzine at bedtime.   Good sleep habits (sometimes referred to as "sleep hygiene") can help you get a good night's sleep. Some habits that can improve your sleep health:  Be consistent. Go to bed at the same time each night and get up at the same time each morning, including on the weekends Make sure your bedroom is quiet, dark, relaxing, and at a comfortable temperature Remove electronic devices, such as TVs, computers, and smart phones, from the bedroom Avoid large meals, caffeine, and alcohol before bedtime Get some exercise. Being physically active during the day can help you fall asleep more easily at night.   Regarding lab work today:  Due to recent changes in healthcare laws, you may see the results of your imaging and laboratory studies on MyChart before your provider has had a chance to review them.  I understand that in some cases there may be results that are confusing or concerning to you. Not all laboratory results come back in the same time frame and you may be waiting for multiple results in order to interpret others.  Please give [place] 72 hours in order for your provider to thoroughly review all the results before contacting the office for clarification of your results. If everything is normal, you will get a letter in the mail or a message in My Chart. Please give [place] a call if you do not hear from [place] after 2 weeks.  Please bring all of your medications with you to each visit.    If you haven't already, sign up for My Chart to have easy access to your labs results, and communication with your primary care physician.  Feel free to call with any questions  or concerns at any time, at [phone].   Take care,  Dr. Katherina [facility]

## 2021-04-27 NOTE — Assessment & Plan Note (Addendum)
Trial Atarax at bedtime. Discussed sleep hygiene including making sure bedroom is quiet, dark, relaxing, and at a comfortable temperature. Removing electronic devices from bedroom. Avoiding large meals, alcohol and caffeine before bedtime. Exercising as tolerated as this can help pt fall asleep more easily at night.

## 2021-04-28 LAB — ANEMIA PROFILE B
Basophils Absolute: 0 10*3/uL (ref 0.0–0.2)
Basos: 0 %
EOS (ABSOLUTE): 0.5 10*3/uL — ABNORMAL HIGH (ref 0.0–0.4)
Eos: 8 %
Ferritin: 11 ng/mL — ABNORMAL LOW (ref 15–150)
Folate: 10.8 ng/mL (ref >3.0)
Hematocrit: 33.4 % — ABNORMAL LOW (ref 34.0–46.6)
Hemoglobin: 11.2 g/dL (ref 11.1–15.9)
Immature Grans (Abs): 0 10*3/uL (ref 0.0–0.1)
Immature Granulocytes: 0 %
Iron Saturation: 5 % — CL (ref 15–55)
Iron: 16 ug/dL — ABNORMAL LOW (ref 27–159)
Lymphocytes Absolute: 2.4 10*3/uL (ref 0.7–3.1)
Lymphs: 36 %
MCH: 30.3 pg (ref 26.6–33.0)
MCHC: 33.5 g/dL (ref 31.5–35.7)
MCV: 90 fL (ref 79–97)
Monocytes Absolute: 0.5 10*3/uL (ref 0.1–0.9)
Monocytes: 7 %
Neutrophils Absolute: 3.2 10*3/uL (ref 1.4–7.0)
Neutrophils: 49 %
Platelets: 329 10*3/uL (ref 150–450)
RBC: 3.7 x10E6/uL — ABNORMAL LOW (ref 3.77–5.28)
RDW: 12.6 % (ref 11.7–15.4)
Retic Ct Pct: 0.7 % (ref 0.6–2.6)
Total Iron Binding Capacity: 300 ug/dL (ref 250–450)
UIBC: 284 ug/dL (ref 131–425)
Vitamin B-12: >2000 pg/mL — ABNORMAL HIGH (ref 232–1245)
WBC: 6.7 10*3/uL (ref 3.4–10.8)

## 2021-04-28 LAB — COMPREHENSIVE METABOLIC PANEL
ALT: 13 IU/L (ref 0–32)
AST: 13 IU/L (ref 0–40)
Albumin/Globulin Ratio: 1.5 (ref 1.2–2.2)
Albumin: 3.6 g/dL — ABNORMAL LOW (ref 3.8–4.8)
Alkaline Phosphatase: 55 IU/L (ref 44–121)
BUN/Creatinine Ratio: 19 (ref 9–23)
BUN: 15 mg/dL (ref 6–20)
Bilirubin Total: <0.2 mg/dL (ref 0.0–1.2)
CO2: 22 mmol/L (ref 20–29)
Calcium: 8.6 mg/dL — ABNORMAL LOW (ref 8.7–10.2)
Chloride: 101 mmol/L (ref 96–106)
Creatinine, Ser: 0.78 mg/dL (ref 0.57–1.00)
Globulin, Total: 2.4 g/dL (ref 1.5–4.5)
Glucose: 90 mg/dL (ref 65–99)
Potassium: 4.3 mmol/L (ref 3.5–5.2)
Sodium: 144 mmol/L (ref 134–144)
Total Protein: 6 g/dL (ref 6.0–8.5)
eGFR: 102 mL/min/{1.73_m2} (ref >59)

## 2021-04-28 LAB — VITAMIN D 25 HYDROXY (VIT D DEFICIENCY, FRACTURES): Vit D, 25-Hydroxy: 57 ng/mL (ref 30.0–100.0)

## 2021-04-28 LAB — TSH: TSH: 2.47 u[IU]/mL (ref 0.450–4.500)

## 2021-05-02 ENCOUNTER — Telehealth: Payer: Self-pay | Admitting: Hematology

## 2021-05-02 ENCOUNTER — Other Ambulatory Visit: Payer: Self-pay | Admitting: Physician Assistant

## 2021-05-02 NOTE — Telephone Encounter (Signed)
R/s appts per 5/13 sch msg. Pt aware.  

## 2021-05-17 ENCOUNTER — Telehealth: Payer: Self-pay

## 2021-05-17 ENCOUNTER — Other Ambulatory Visit (HOSPITAL_COMMUNITY): Payer: Self-pay

## 2021-05-17 ENCOUNTER — Other Ambulatory Visit: Payer: Self-pay | Admitting: Physician Assistant

## 2021-05-17 DIAGNOSIS — M0579 Rheumatoid arthritis with rheumatoid factor of multiple sites without organ or systems involvement: Secondary | ICD-10-CM

## 2021-05-17 MED ORDER — CERTOLIZUMAB PEGOL 2 X 200 MG/ML ~~LOC~~ PSKT
PREFILLED_SYRINGE | SUBCUTANEOUS | 1 refills | Status: DC
  Filled 2021-05-17: qty 1, 28d supply, fill #0
  Filled 2021-06-13: qty 1, 28d supply, fill #1

## 2021-05-17 NOTE — Telephone Encounter (Signed)
Devki, PharmD is having the pharmacy process refill of cimzia. Medication will be delivered to the clinic via courier. Patient is scheduled for next Cimzia injection on 05/23/2021.

## 2021-05-18 ENCOUNTER — Other Ambulatory Visit (HOSPITAL_COMMUNITY): Payer: Self-pay

## 2021-05-23 ENCOUNTER — Ambulatory Visit (INDEPENDENT_AMBULATORY_CARE_PROVIDER_SITE_OTHER): Payer: Medicaid Other | Admitting: *Deleted

## 2021-05-23 ENCOUNTER — Other Ambulatory Visit: Payer: Self-pay

## 2021-05-23 VITALS — BP 149/86 | HR 104

## 2021-05-23 DIAGNOSIS — M0579 Rheumatoid arthritis with rheumatoid factor of multiple sites without organ or systems involvement: Secondary | ICD-10-CM | POA: Diagnosis not present

## 2021-05-23 MED ORDER — CERTOLIZUMAB PEGOL 2 X 200 MG ~~LOC~~ KIT: PACK | Freq: Once | SUBCUTANEOUS | Status: AC

## 2021-05-23 NOTE — Progress Notes (Signed)
Pharmacy Note  Subjective:   Patient presents to clinic today to receive monthly dose of Cimzia.  Patient running a fever or have signs/symptoms of infection? No  Patient currently on antibiotics for the treatment of infection? No  Patient have any upcoming invasive procedures/surgeries? No  Objective: CMP     Component Value Date/Time   NA 144 04/27/2021 0929   K 4.3 04/27/2021 0929   CL 101 04/27/2021 0929   CO2 22 04/27/2021 0929   GLUCOSE 90 04/27/2021 0929   GLUCOSE 97 03/07/2021 1440   BUN 15 04/27/2021 0929   CREATININE 0.78 04/27/2021 0929   CREATININE 0.74 03/07/2021 1440   CALCIUM 8.6 (L) 04/27/2021 0929   PROT 6.0 04/27/2021 0929   ALBUMIN 3.6 (L) 04/27/2021 0929   AST 13 04/27/2021 0929   AST 18 01/30/2020 1149   ALT 13 04/27/2021 0929   ALT 19 01/30/2020 1149   ALKPHOS 55 04/27/2021 0929   BILITOT <0.2 04/27/2021 0929   BILITOT 0.2 (L) 01/30/2020 1149   GFRNONAA 105 03/07/2021 1440   GFRAA 122 03/07/2021 1440    CBC    Component Value Date/Time   WBC 6.7 04/27/2021 0929   WBC 7.3 03/07/2021 1440   RBC 3.70 (L) 04/27/2021 0929   RBC 4.05 03/07/2021 1440   HGB 11.2 04/27/2021 0929   HCT 33.4 (L) 04/27/2021 0929   PLT 329 04/27/2021 0929   MCV 90 04/27/2021 0929   MCH 30.3 04/27/2021 0929   MCH 31.4 03/07/2021 1440   MCHC 33.5 04/27/2021 0929   MCHC 33.5 03/07/2021 1440   RDW 12.6 04/27/2021 0929   LYMPHSABS 2.4 04/27/2021 0929   MONOABS 0.4 01/30/2020 1149   EOSABS 0.5 (H) 04/27/2021 0929   BASOSABS 0.0 04/27/2021 0929    Baseline Immunosuppressant Therapy Labs TB GOLD Quantiferon TB Gold Latest Ref Rng & Units 05/07/2020  Quantiferon TB Gold Plus NEGATIVE NEGATIVE   Hepatitis Panel   HIV Lab Results  Component Value Date   HIV Non Reactive 07/07/2019   Immunoglobulins   SPEP Serum Protein Electrophoresis Latest Ref Rng & Units 04/27/2021  Total Protein 6.0 - 8.5 g/dL 6.0   G6PD No results found for: G6PDH TPMT No results found  for: TPMT   Chest x-ray: 12/31/2015 No active cardiopulmonary disease.  Assessment/Plan:   Administrations This Visit    Certolizumab Pegol KIT    Admin Date 05/23/2021 Action Given Dose  Route Subcutaneous Administered By ,  L, LPN          Patient tolerated injection well.   Appointment for next injection scheduled for 06/21/2021.  Patient due for labs in June 2022. Patient states she will return to the office on 05/27/2021 to have them drawn.  Patient is to call and reschedule appointment if running a fever with signs/symptoms of infection, on antibiotics for active infection or has an upcoming invasive procedure.  All questions encouraged and answered.  Instructed patient to call with any further questions or concerns.  

## 2021-05-25 ENCOUNTER — Other Ambulatory Visit: Payer: Self-pay | Admitting: Physician Assistant

## 2021-05-26 ENCOUNTER — Other Ambulatory Visit: Payer: Self-pay

## 2021-05-26 MED ORDER — PREDNISONE 5 MG PO TABS: ORAL_TABLET | ORAL | 0 refills | Status: DC

## 2021-05-26 NOTE — Telephone Encounter (Signed)
Patient left voicemail yesterday (05/25/21 at 5:05 pm) requesting prescription of Prednisone.  Patient states she woke up with swelling in her right knee.  Patient states it is a sharp pain that becomes dull the more she rests it.  Patient states she started her job 3 weeks ago and that could be the cause of the swelling and pain.

## 2021-05-26 NOTE — Telephone Encounter (Signed)
Left message to advise patient prescription for prednisone is going to be sent to the pharmacy. Patient advised to notify us if she continues to have recurrent flares.     Patient advised she should take prednisone in the morning with food and avoid NSAIDs while taking prednisone.

## 2021-05-26 NOTE — Telephone Encounter (Signed)
Ok to send in prednisone 20 mg tapering by 5 mg every 2 days. Please advise the patient to notify us if she continues to have recurrent flares.    She should take prednisone in the morning with food and avoid NSAIDs while taking prednisone.

## 2021-06-02 ENCOUNTER — Other Ambulatory Visit: Payer: Self-pay | Admitting: *Deleted

## 2021-06-02 DIAGNOSIS — Z79899 Other long term (current) drug therapy: Secondary | ICD-10-CM

## 2021-06-02 DIAGNOSIS — Z111 Encounter for screening for respiratory tuberculosis: Secondary | ICD-10-CM

## 2021-06-03 NOTE — Progress Notes (Signed)
Hemoglobin is borderline low-11.5 but is improved from 1 month ago.  Absolute eosinophils are slightly elevated.  Rest of CBC WNL.  CMP WNL.  We will continue to monitor.

## 2021-06-05 LAB — CBC WITH DIFFERENTIAL/PLATELET
Absolute Monocytes: 496 cells/uL (ref 200–950)
Basophils Absolute: 48 cells/uL (ref 0–200)
Basophils Relative: 0.7 %
Eosinophils Absolute: 782 cells/uL — ABNORMAL HIGH (ref 15–500)
Eosinophils Relative: 11.5 %
HCT: 35.1 % (ref 35.0–45.0)
Hemoglobin: 11.5 g/dL — ABNORMAL LOW (ref 11.7–15.5)
Lymphs Abs: 1720 cells/uL (ref 850–3900)
MCH: 29.3 pg (ref 27.0–33.0)
MCHC: 32.8 g/dL (ref 32.0–36.0)
MCV: 89.5 fL (ref 80.0–100.0)
MPV: 11.6 fL (ref 7.5–12.5)
Monocytes Relative: 7.3 %
Neutro Abs: 3754 cells/uL (ref 1500–7800)
Neutrophils Relative %: 55.2 %
Platelets: 338 10*3/uL (ref 140–400)
RBC: 3.92 10*6/uL (ref 3.80–5.10)
RDW: 14.2 % (ref 11.0–15.0)
Total Lymphocyte: 25.3 %
WBC: 6.8 10*3/uL (ref 3.8–10.8)

## 2021-06-05 LAB — COMPLETE METABOLIC PANEL WITH GFR
AG Ratio: 2.2 (calc) (ref 1.0–2.5)
ALT: 11 U/L (ref 6–29)
AST: 16 U/L (ref 10–30)
Albumin: 4.2 g/dL (ref 3.6–5.1)
Alkaline phosphatase (APISO): 50 U/L (ref 31–125)
BUN: 21 mg/dL (ref 7–25)
CO2: 27 mmol/L (ref 20–32)
Calcium: 8.7 mg/dL (ref 8.6–10.2)
Chloride: 106 mmol/L (ref 98–110)
Creat: 0.84 mg/dL (ref 0.50–1.10)
GFR, Est African American: 104 mL/min/{1.73_m2} (ref >=60)
GFR, Est Non African American: 90 mL/min/{1.73_m2} (ref >=60)
Globulin: 1.9 g/dL (calc) (ref 1.9–3.7)
Glucose, Bld: 77 mg/dL (ref 65–99)
Potassium: 4.4 mmol/L (ref 3.5–5.3)
Sodium: 140 mmol/L (ref 135–146)
Total Bilirubin: 0.3 mg/dL (ref 0.2–1.2)
Total Protein: 6.1 g/dL (ref 6.1–8.1)

## 2021-06-05 LAB — QUANTIFERON-TB GOLD PLUS
Mitogen-NIL: >10 IU/mL
NIL: 0.03 IU/mL
QuantiFERON-TB Gold Plus: NEGATIVE
TB1-NIL: 0.03 IU/mL
TB2-NIL: 0.03 IU/mL

## 2021-06-06 NOTE — Progress Notes (Signed)
TB gold negative

## 2021-06-07 ENCOUNTER — Other Ambulatory Visit (HOSPITAL_COMMUNITY): Payer: Self-pay

## 2021-06-13 ENCOUNTER — Other Ambulatory Visit (HOSPITAL_COMMUNITY): Payer: Self-pay

## 2021-06-16 ENCOUNTER — Other Ambulatory Visit (HOSPITAL_COMMUNITY): Payer: Self-pay

## 2021-06-17 ENCOUNTER — Other Ambulatory Visit (HOSPITAL_COMMUNITY): Payer: Self-pay

## 2021-06-20 ENCOUNTER — Other Ambulatory Visit: Payer: Self-pay | Admitting: Physician Assistant

## 2021-06-21 ENCOUNTER — Other Ambulatory Visit: Payer: Self-pay

## 2021-06-21 ENCOUNTER — Other Ambulatory Visit: Payer: Self-pay | Admitting: *Deleted

## 2021-06-21 ENCOUNTER — Ambulatory Visit (INDEPENDENT_AMBULATORY_CARE_PROVIDER_SITE_OTHER): Payer: No Typology Code available for payment source | Admitting: *Deleted

## 2021-06-21 ENCOUNTER — Telehealth: Payer: Self-pay

## 2021-06-21 DIAGNOSIS — M0579 Rheumatoid arthritis with rheumatoid factor of multiple sites without organ or systems involvement: Secondary | ICD-10-CM

## 2021-06-21 DIAGNOSIS — D509 Iron deficiency anemia, unspecified: Secondary | ICD-10-CM

## 2021-06-21 MED ORDER — CERTOLIZUMAB PEGOL 2 X 200 MG ~~LOC~~ KIT: PACK | Freq: Once | SUBCUTANEOUS | Status: AC

## 2021-06-21 NOTE — Telephone Encounter (Signed)
Medication removed from refrigerator for patient's appointment.

## 2021-06-21 NOTE — Telephone Encounter (Signed)
Patient called to let you know that she is leaving her house now so you can take the injection out of the refrigerator.

## 2021-06-21 NOTE — Progress Notes (Signed)
Pharmacy Note  Subjective:   Patient presents to clinic today to receive monthly dose of Cimzia.  Patient running a fever or have signs/symptoms of infection? No  Patient currently on antibiotics for the treatment of infection? No  Patient have any upcoming invasive procedures/surgeries? No  Objective: CMP     Component Value Date/Time   NA 140 06/02/2021 1429   NA 144 04/27/2021 0929   K 4.4 06/02/2021 1429   CL 106 06/02/2021 1429   CO2 27 06/02/2021 1429   GLUCOSE 77 06/02/2021 1429   BUN 21 06/02/2021 1429   BUN 15 04/27/2021 0929   CREATININE 0.84 06/02/2021 1429   CALCIUM 8.7 06/02/2021 1429   PROT 6.1 06/02/2021 1429   PROT 6.0 04/27/2021 0929   ALBUMIN 3.6 (L) 04/27/2021 0929   AST 16 06/02/2021 1429   AST 18 01/30/2020 1149   ALT 11 06/02/2021 1429   ALT 19 01/30/2020 1149   ALKPHOS 55 04/27/2021 0929   BILITOT 0.3 06/02/2021 1429   BILITOT <0.2 04/27/2021 0929   BILITOT 0.2 (L) 01/30/2020 1149   GFRNONAA 90 06/02/2021 1429   GFRAA 104 06/02/2021 1429    CBC    Component Value Date/Time   WBC 6.8 06/02/2021 1429   RBC 3.92 06/02/2021 1429   HGB 11.5 (L) 06/02/2021 1429   HGB 11.2 04/27/2021 0929   HCT 35.1 06/02/2021 1429   HCT 33.4 (L) 04/27/2021 0929   PLT 338 06/02/2021 1429   PLT 329 04/27/2021 0929   MCV 89.5 06/02/2021 1429   MCV 90 04/27/2021 0929   MCH 29.3 06/02/2021 1429   MCHC 32.8 06/02/2021 1429   RDW 14.2 06/02/2021 1429   RDW 12.6 04/27/2021 0929   LYMPHSABS 1,720 06/02/2021 1429   LYMPHSABS 2.4 04/27/2021 0929   MONOABS 0.4 01/30/2020 1149   EOSABS 782 (H) 06/02/2021 1429   EOSABS 0.5 (H) 04/27/2021 0929   BASOSABS 48 06/02/2021 1429   BASOSABS 0.0 04/27/2021 0929    Baseline Immunosuppressant Therapy Labs TB GOLD Quantiferon TB Gold Latest Ref Rng & Units 06/02/2021  Quantiferon TB Gold Plus NEGATIVE NEGATIVE   Hepatitis Panel   HIV Lab Results  Component Value Date   HIV Non Reactive 07/07/2019   Immunoglobulins    SPEP Serum Protein Electrophoresis Latest Ref Rng & Units 06/02/2021  Total Protein 6.1 - 8.1 g/dL 6.1   G6PD No results found for: G6PDH TPMT No results found for: TPMT   Chest x-ray: 12/31/2015 No active cardiopulmonary disease.  Assessment/Plan:   Administrations This Visit     Certolizumab Pegol KIT     Admin Date 06/21/2021 Action Given Dose  Route Subcutaneous Administered By Carole Binning, LPN             Patient tolerated injection well.   Appointment for next injection scheduled for 07/21/2021.  Patient due for labs in September 2022.  Patient is to call and reschedule appointment if running a fever with signs/symptoms of infection, on antibiotics for active infection or has an upcoming invasive procedure.  All questions encouraged and answered.  Instructed patient to call with any further questions or concerns.

## 2021-06-21 NOTE — Telephone Encounter (Signed)
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  Absoluteeosinophils are slightly elevated.  Rest of CBC WNL.  CMP WNL.    Okay to refill MTX?

## 2021-06-22 ENCOUNTER — Inpatient Hospital Stay: Payer: Medicaid Other

## 2021-06-22 ENCOUNTER — Inpatient Hospital Stay: Payer: Medicaid Other | Attending: Hematology | Admitting: Hematology

## 2021-07-12 ENCOUNTER — Other Ambulatory Visit: Payer: Self-pay | Admitting: Physician Assistant

## 2021-07-12 ENCOUNTER — Other Ambulatory Visit (HOSPITAL_COMMUNITY): Payer: Self-pay

## 2021-07-12 MED ORDER — CIMZIA PREFILLED 2 X 200 MG/ML ~~LOC~~ PSKT
PREFILLED_SYRINGE | SUBCUTANEOUS | 1 refills | Status: AC
  Filled 2021-07-12: qty 1, 28d supply, fill #0

## 2021-07-12 NOTE — Telephone Encounter (Signed)
Next Visit: 08/11/2021  Last Visit: 03/10/2021  Last Fill: 03/31/2021  GO:[id number] arthritis with rheumatoid factor of multiple sites without organ or systems involvement  Current Dose per office note 03/10/2021: Cimzia 400 mg subcutaneous injections every 28 days  Labs: 06/02/2021, Hemoglobin is borderline low-11.5 but is improved from 1 month ago.  Absolute eosinophils are slightly elevated.  Rest of CBC WNL.  CMP WNL.  We willcontinue to monitor.   TB Gold: 06/02/2021, negative   Okay to refill Cimzia?

## 2021-07-13 ENCOUNTER — Other Ambulatory Visit: Payer: Self-pay | Admitting: Rheumatology

## 2021-07-13 MED ORDER — PREDNISONE 5 MG PO TABS: ORAL_TABLET | ORAL | 0 refills | Status: AC

## 2021-07-13 NOTE — Telephone Encounter (Signed)
Patient is having a flare with her right wrist. It has been sore for two days. Patient request rx to [facility] [address] .

## 2021-07-13 NOTE — Telephone Encounter (Signed)
Please clarify if she has missed any doses of methotrexate.   A prednisone taper was sent to the pharmacy on 03/31/21 and 05/26/21.  We will need to discuss other treatment options at her upcoming appointment on 08/11/21 if she continues to have recurrent flares.   Ok to send in prednisone 20 mg tapering by 5 mg every 2 days.

## 2021-07-13 NOTE — Telephone Encounter (Signed)
Patient states she has not missed any doses of MTX. Patient advised a prescription for prednisone will be sent to the pharmacy. Patient advised if she is having recurrent flares we will need to discuss other treatment options at her upcoming appointment on 08/11/2021.

## 2021-07-13 NOTE — Telephone Encounter (Signed)
Patient is having a flare with her right wrist. It has been sore for two days. Patient states it is feeling really stiff. Patient states her fingers are sore. Patient states she has taken some Ibuprofen gave and ice gave a little relief.  Patient [facility] rx [facility] [address] . Please advise.

## 2021-07-14 ENCOUNTER — Other Ambulatory Visit (HOSPITAL_COMMUNITY): Payer: Self-pay

## 2021-07-15 ENCOUNTER — Telehealth: Payer: Self-pay

## 2021-07-15 ENCOUNTER — Encounter: Payer: Self-pay | Admitting: Hematology

## 2021-07-15 ENCOUNTER — Other Ambulatory Visit (HOSPITAL_COMMUNITY): Payer: Self-pay

## 2021-07-15 NOTE — Telephone Encounter (Signed)
Patient obtained a new primary ins that is requiring her to fill through [facility]. One-time override is not allowed through prescription benefits. Will try to process as medical but need phone number on back of patient's card. LVM with pt requesting call back, direct office number provided.

## 2021-07-15 NOTE — Telephone Encounter (Signed)
Pt returned call, insurance carrier is [employer], phone# [phone]. Explained general situation to pt, and offered to f/u with pt with findings. Pt provided verbal consent to leave detailed VM if needed.

## 2021-07-15 NOTE — Telephone Encounter (Signed)
Per rep, J-code is not on the plan exclusion list therefore we would be approved to bill through medical. A letter for a One Time Exemption from the provider will need to be faxed in to [facility] in order to proceed, however.   Fax# [phone]

## 2021-07-20 NOTE — Telephone Encounter (Signed)
Provider and patient forms obtained and uploaded to [employer] portal. Case initiated to determine determine specifics around patient's coverage.

## 2021-07-21 ENCOUNTER — Ambulatory Visit (INDEPENDENT_AMBULATORY_CARE_PROVIDER_SITE_OTHER): Payer: No Typology Code available for payment source | Admitting: *Deleted

## 2021-07-21 ENCOUNTER — Other Ambulatory Visit: Payer: Self-pay

## 2021-07-21 ENCOUNTER — Encounter: Payer: Self-pay | Admitting: Hematology

## 2021-07-21 ENCOUNTER — Telehealth: Payer: Self-pay

## 2021-07-21 VITALS — BP 147/92 | HR 112

## 2021-07-21 DIAGNOSIS — M0579 Rheumatoid arthritis with rheumatoid factor of multiple sites without organ or systems involvement: Secondary | ICD-10-CM

## 2021-07-21 MED ORDER — CERTOLIZUMAB PEGOL 2 X 200 MG ~~LOC~~ KIT
400.0000 mg | PACK | Freq: Once | SUBCUTANEOUS | Status: AC
  Administered 2021-07-21: 400 mg via SUBCUTANEOUS

## 2021-07-21 NOTE — Telephone Encounter (Signed)
Referral placed.

## 2021-07-21 NOTE — Telephone Encounter (Signed)
Patient left a voicemail stating she spoke with someone at [facility] and they just need need a referral sent via Epic to the referral department at [facility].  The referral needs to include that she is on Cimzia and gets it monthly in the office.  Patient states she is hoping to get in this month.  Patient states she spoke with a representative at [facility] and was told that she is covered to be seen at our office and her co-pay would be $70, but the medication which is through [facility] is not covered unless it is through a [facility] provider.  If you have any questions, please call back at #[phone]

## 2021-07-21 NOTE — Progress Notes (Signed)
Pharmacy Note  Subjective:   Patient presents to clinic today to receive monthly dose of Cimzia.  Patient running a fever or have signs/symptoms of infection? No  Patient currently on antibiotics for the treatment of infection? No  Patient have any upcoming invasive procedures/surgeries? No  Objective: CMP     Component Value Date/Time   NA 140 06/02/2021 1429   NA 144 04/27/2021 0929   K 4.4 06/02/2021 1429   CL 106 06/02/2021 1429   CO2 27 06/02/2021 1429   GLUCOSE 77 06/02/2021 1429   BUN 21 06/02/2021 1429   BUN 15 04/27/2021 0929   CREATININE 0.84 06/02/2021 1429   CALCIUM 8.7 06/02/2021 1429   PROT 6.1 06/02/2021 1429   PROT 6.0 04/27/2021 0929   ALBUMIN 3.6 (L) 04/27/2021 0929   AST 16 06/02/2021 1429   AST 18 01/30/2020 1149   ALT 11 06/02/2021 1429   ALT 19 01/30/2020 1149   ALKPHOS 55 04/27/2021 0929   BILITOT 0.3 06/02/2021 1429   BILITOT <0.2 04/27/2021 0929   BILITOT 0.2 (L) 01/30/2020 1149   GFRNONAA 90 06/02/2021 1429   GFRAA 104 06/02/2021 1429    CBC    Component Value Date/Time   WBC 6.8 06/02/2021 1429   RBC 3.92 06/02/2021 1429   HGB 11.5 (L) 06/02/2021 1429   HGB 11.2 04/27/2021 0929   HCT 35.1 06/02/2021 1429   HCT 33.4 (L) 04/27/2021 0929   PLT 338 06/02/2021 1429   PLT 329 04/27/2021 0929   MCV 89.5 06/02/2021 1429   MCV 90 04/27/2021 0929   MCH 29.3 06/02/2021 1429   MCHC 32.8 06/02/2021 1429   RDW 14.2 06/02/2021 1429   RDW 12.6 04/27/2021 0929   LYMPHSABS 1,720 06/02/2021 1429   LYMPHSABS 2.4 04/27/2021 0929   MONOABS 0.4 01/30/2020 1149   EOSABS 782 (H) 06/02/2021 1429   EOSABS 0.5 (H) 04/27/2021 0929   BASOSABS 48 06/02/2021 1429   BASOSABS 0.0 04/27/2021 0929    Baseline Immunosuppressant Therapy Labs TB GOLD Quantiferon TB Gold Latest Ref Rng & Units 06/02/2021  Quantiferon TB Gold Plus NEGATIVE NEGATIVE   Hepatitis Panel   HIV Lab Results  Component Value Date   HIV Non Reactive 07/07/2019   Immunoglobulins    SPEP Serum Protein Electrophoresis Latest Ref Rng & Units 06/02/2021  Total Protein 6.1 - 8.1 g/dL 6.1   G6PD No results found for: G6PDH TPMT No results found for: TPMT   Chest x-ray: 12/31/2015 No active cardiopulmonary disease  Assessment/Plan:   Administrations This Visit     Certolizumab Pegol KIT 400 mg     Admin Date 07/21/2021 Action Given Dose 400 mg Route Subcutaneous Administered By Carole Binning, LPN             Patient tolerated injection well.   Appointment for next injection not scheduled as patient is transferring care. Patient will be due for next injection on 08/18/2021.  Patient due for labs in September 2022.  Patient is to call and reschedule appointment if running a fever with signs/symptoms of infection, on antibiotics for active infection or has an upcoming invasive procedure.  All questions encouraged and answered.  Instructed patient to call with any further questions or concerns.

## 2021-07-22 NOTE — Telephone Encounter (Addendum)
Per [employer] response, patient will need to fill Cimzia at [facility] moving forward. Patient received Cimzia in the clinic yesterday (used sample) and referral has been placed to [facility] for further care.   Email sent to [facility] to deactivate patient as she will no longer be filling there.  Chesley Mires, PharmD, MPH, BCPS Clinical Pharmacist (Rheumatology and Pulmonology)

## 2021-08-11 ENCOUNTER — Ambulatory Visit: Payer: Medicaid Other | Admitting: Physician Assistant

## 2021-09-30 ENCOUNTER — Other Ambulatory Visit: Payer: Self-pay | Admitting: Rheumatology

## 2022-05-23 ENCOUNTER — Encounter: Payer: Self-pay | Admitting: *Deleted

## 2022-12-22 ENCOUNTER — Other Ambulatory Visit (HOSPITAL_COMMUNITY): Payer: Self-pay
